# Patient Record
Sex: Female | Born: 1970 | Race: Black or African American | Hispanic: No | Marital: Single | Smoking: Never smoker
Health system: Southern US, Community
[De-identification: ages and names within clinical notes are randomized; demographics above are authoritative.]

## PROBLEM LIST (undated history)

## (undated) DIAGNOSIS — F419 Anxiety disorder, unspecified: Secondary | ICD-10-CM

## (undated) DIAGNOSIS — K219 Gastro-esophageal reflux disease without esophagitis: Secondary | ICD-10-CM

## (undated) DIAGNOSIS — J301 Allergic rhinitis due to pollen: Secondary | ICD-10-CM

## (undated) HISTORY — DX: Anxiety disorder, unspecified: F41.9

## (undated) HISTORY — DX: Allergic rhinitis due to pollen: J30.1

## (undated) HISTORY — DX: Gastro-esophageal reflux disease without esophagitis: K21.9

---

## 2007-08-31 ENCOUNTER — Encounter (INDEPENDENT_AMBULATORY_CARE_PROVIDER_SITE_OTHER): Payer: Self-pay | Admitting: Obstetrics and Gynecology

## 2007-08-31 ENCOUNTER — Ambulatory Visit (HOSPITAL_COMMUNITY): Admission: RE | Admit: 2007-08-31 | Discharge: 2007-08-31 | Payer: Self-pay | Admitting: Obstetrics and Gynecology

## 2008-09-12 ENCOUNTER — Observation Stay (HOSPITAL_COMMUNITY): Admission: AD | Admit: 2008-09-12 | Discharge: 2008-09-12 | Payer: Self-pay | Admitting: Obstetrics and Gynecology

## 2008-09-12 ENCOUNTER — Encounter (INDEPENDENT_AMBULATORY_CARE_PROVIDER_SITE_OTHER): Payer: Self-pay | Admitting: Obstetrics & Gynecology

## 2009-09-08 ENCOUNTER — Ambulatory Visit (HOSPITAL_COMMUNITY): Admission: RE | Admit: 2009-09-08 | Discharge: 2009-09-08 | Payer: Self-pay | Admitting: Obstetrics and Gynecology

## 2010-09-02 LAB — CROSSMATCH: ABO/RH(D): A POS

## 2010-09-02 LAB — CBC
Hemoglobin: 11.9 g/dL — ABNORMAL LOW (ref 12.0–15.0)
RDW: 14.9 % (ref 11.5–15.5)
WBC: 10.1 10*3/uL (ref 4.0–10.5)

## 2010-10-01 ENCOUNTER — Other Ambulatory Visit: Payer: Self-pay | Admitting: Obstetrics and Gynecology

## 2010-10-02 ENCOUNTER — Ambulatory Visit (HOSPITAL_COMMUNITY)
Admission: RE | Admit: 2010-10-02 | Discharge: 2010-10-02 | Disposition: A | Discharge status: Home / Self Care | Payer: BC Managed Care – PPO | Source: Ambulatory Visit | Attending: Obstetrics and Gynecology | Admitting: Obstetrics and Gynecology

## 2010-10-02 ENCOUNTER — Other Ambulatory Visit: Payer: Self-pay | Admitting: Obstetrics and Gynecology

## 2010-10-02 DIAGNOSIS — D259 Leiomyoma of uterus, unspecified: Secondary | ICD-10-CM | POA: Insufficient documentation

## 2010-10-02 DIAGNOSIS — O021 Missed abortion: Secondary | ICD-10-CM | POA: Insufficient documentation

## 2010-10-02 LAB — CBC
Hemoglobin: 13.1 g/dL (ref 12.0–15.0)
MCH: 29.8 pg (ref 26.0–34.0)
MCHC: 33.1 g/dL (ref 30.0–36.0)
MCV: 90.2 fL (ref 78.0–100.0)

## 2010-10-06 NOTE — Op Note (Signed)
NAME:  Summer Robinson, Summer Robinson          ACCOUNT NO.:  [id number]   MEDICAL RECORD NO.:  [id number]          PATIENT TYPE:  INP   LOCATION:  9373                          FACILITY:  [facility]   PHYSICIAN:  Genia Del, M.D.DATE OF BIRTH:  1971/04/09   DATE OF PROCEDURE:  09/12/2008  DATE OF DISCHARGE:                               OPERATIVE REPORT   The patient came to [facility] for surgery on September 12, 2008.   PREOPERATIVE DIAGNOSES:  16+ weeks' gestation with premature rupture of  membranes, cervix dilated to 2 cm, and cord prolapsed.   POSTOPERATIVE DIAGNOSES:  16+ weeks' gestation with premature rupture of  membranes, cervix dilated to 2 cm, and cord prolapsed plus confirmed  empty uterine cavity postprocedure by ultrasound.   PROCEDURE:  Dilatation and extraction under ultrasound guidance.   SURGEON:  Genia Del, MD   ANESTHESIOLOGIST:  Tyrone Apple. Malen Gauze, MD   PROCEDURE:  Under general anesthesia with endotracheal intubation, the  patient was in lithotomy position.  She was prepped with Betadine on the  suprapubic vulvar and vaginal areas and draped as usual.  A vaginal exam  revealed a uterus about 16 cm, mobile, no adnexal mass.  No vaginal  bleeding.  The speculum was introduced in the vagina.  The cervix was  seen.  It was dilated with cord prolapse.  An ultrasound was done at  that point revealing that the fetal heart tones were still present at  1:25.  the fetus was in breech presentation with severe oligo.  We then  grasped the anterior lip of the cervix with a tenaculum.  We proceeded  with a paracervical block with lidocaine 1% a total of 18 mL at 4 and 8  o'clock.  We then dilated the cervix with Hegar dilators.  A #49 passed  easily.  We then completed dilatation up to #55 without difficulty.  We  then used a large fenestrated clamp to extract the fetal parts.  All  fetal parts were removed under ultrasound guidance.  We then used a #16  suction curette  to suction the anterior placenta.  Once this was  achieved, the cavity appeared empty.  We completed the curettage with a  large sharp curette.  We gently curettaged the intrauterine cavity on  all surfaces.  We then go back with the suction curette to assure that  the intrauterine cavity was well emptied.  This was confirmed by  ultrasound.  We saw a thin regular endometrial line and no products of  conception.  We therefore removed the tenaculum.  The uterus was  contracting well.  Hemostasis was adequate.  We therefore removed the  speculum.  We then counted all fetal parts and it was complete.  We sent  a portion of the fetal material for genetic testing.  Full chromosomes  will be done and we sent the rest to Pathology.  The patient had  received cefoxitin 1 g IV prior to induction.  The estimated blood loss  was about 200 mL.  No complication occurred and the patient was brought  to recovery room in good stable status.  Note that  she will be  discharged on Flagyl 500 b.i.d. for 7 days.     Genia Del, M.D.  Electronically Signed    ML/MEDQ  D:  09/12/2008  T:  09/13/2008  Job:  [id number]

## 2010-10-06 NOTE — Op Note (Signed)
NAME:  Summer Robinson, Summer Robinson          ACCOUNT NO.:  [id number]   MEDICAL RECORD NO.:  [id number]          PATIENT TYPE:  AMB   LOCATION:  SDC                           FACILITY:  [facility]   PHYSICIAN:  Lenoard Aden, M.D.DATE OF BIRTH:  02/12/1971   DATE OF PROCEDURE:  08/31/2007  DATE OF DISCHARGE:                               OPERATIVE REPORT   PREOPERATIVE DIAGNOSIS:  Missed abortion.   POSTOPERATIVE DIAGNOSES:  Missed abortion.   PROCEDURE:  Suction dilatation and evacuation.   SURGEON:  Lenoard Aden, M.D.   ANESTHESIA:  MAC, paracervical.   BLOOD LOSS:  Less 50 mL.   COMPLICATIONS:  None.   COUNTS:  Correct.   CONDITION:  Patient brought to recovery room in good condition.   PATHOLOGY:  Specimen sent to pathology.   BRIEF OPERATIVE NOTE:  Being apprised of the risks of anesthesia,  infection, bleeding, injury to abdominal organs, need for repair,  delayed risks of complications to include bowel and bladder injury,  secondary uterine perforation, possible need for repair, the patient was  brought to the operating room where she was administered IV sedation.  After appropriately prepped and draped in sterile fashion, catheterized  until the bladder was emptied.  A loop paracervical block placed using  20 mL of a dilute Xylocaine solution in standard fashion at 4 and 8  o'clock.  Cervix sounds easily to about 12 cm and bimanual exam reveals  known uterine fibroids.  The cervix is easily dilated up to #21 [facility]  dilator, 7 mm suction curette placed.  Suction reveals products of  conception. Repeat suction and curettage in a four quadrant method  reveals cavity to be empty.  Good hemostasis noted.  All incisions  removed from the vagina.  Tissue sent to pathology.  The patient was  sent to recovery in good condition.      Lenoard Aden, M.D.  Electronically Signed     RJT/MEDQ  D:  08/31/2007  T:  08/31/2007  Job:  [id number]

## 2010-10-10 NOTE — Op Note (Signed)
  NAMEMarland Kitchen  Summer Robinson, Summer Robinson           ACCOUNT NO.:  [id number]  MEDICAL RECORD NO.:  [id number]           PATIENT TYPE:  O  LOCATION:  [facility]                          [facility]ACILITY:  [facility]  PHYSICIAN:  Maxie Better, M.D.DATE OF BIRTH:  03/31/1971  DATE OF PROCEDURE:  10/02/2010 DATE OF DISCHARGE:                              OPERATIVE REPORT   PREOPERATIVE DIAGNOSES: 1. Missed abortion. 2. Uterine fibroids. 3. Recurrent pregnancy loss.  PROCEDURE:  Suction, dilation, and evacuation.  POSTOPERATIVE DIAGNOSES: 1. Missed abortion. 2. Uterine fibroids. 3. Recurrent pregnancy loss.  ANESTHESIA:  MAC, paracervical block.  SURGEON:  Maxie Better, MD  ASSISTANT:  None.  PROCEDURE IN DETAIL:  Under adequate monitored anesthesia, the patient was placed in the dorsal lithotomy position.  She was sterilely prepped and draped in usual fashion.  Bladder was catheterized with moderate amount of urine.  Examination under anesthesia revealed an irregular, enlarged uterus.  No adnexal masses could be appreciated.  Bivalve speculum was placed in the vagina.  Single-tooth tenaculum was placed on the anterior lip of the cervix.  20 mL of 1% Nesacaine was injected paracervically at the 3 and 9 o'clock positions.  The cervix was serially dilated up to #33 [facility] dilator.  A #8-mm curved suction cannula was introduced into the uterine cavity.  Large amount of products of conception was obtained.  The cavity was curetted and suctioned until all tissue was felt to be removed, at which time, all instruments were then removed from the vagina.  Specimen labeled products of conception and was sent to pathology.  Estimated blood loss was minimal.  Complications none.  The patient tolerated the procedure well and was transferred to recovery room in stable condition.     Maxie Better, M.D.    [place]/MEDQ  D:  10/02/2010  T:  Oct 17, 2010  Job:  [id number]  Electronically Signed by Nena Jordan  Doaa Kendzierski M.D. on 10/10/2010 06:48:04 PM

## 2010-10-23 DEATH — deceased

## 2011-02-16 LAB — CBC
Hemoglobin: 12.6
MCHC: 33.8
MCV: 91.3

## 2012-08-01 ENCOUNTER — Other Ambulatory Visit: Payer: Self-pay

## 2012-08-01 DIAGNOSIS — Z1231 Encounter for screening mammogram for malignant neoplasm of breast: Secondary | ICD-10-CM

## 2012-08-21 ENCOUNTER — Ambulatory Visit
Admission: RE | Admit: 2012-08-21 | Discharge: 2012-08-21 | Disposition: A | Discharge status: Home / Self Care | Payer: BC Managed Care – PPO | Source: Ambulatory Visit

## 2012-08-21 DIAGNOSIS — Z1231 Encounter for screening mammogram for malignant neoplasm of breast: Secondary | ICD-10-CM

## 2013-07-02 ENCOUNTER — Encounter (HOSPITAL_COMMUNITY): Payer: Self-pay | Admitting: Emergency Medicine

## 2013-07-02 ENCOUNTER — Emergency Department (HOSPITAL_COMMUNITY): Payer: BC Managed Care – PPO

## 2013-07-02 ENCOUNTER — Emergency Department (HOSPITAL_COMMUNITY)
Admission: EM | Admit: 2013-07-02 | Discharge: 2013-07-02 | Disposition: A | Discharge status: Home / Self Care | Payer: BC Managed Care – PPO | Attending: Emergency Medicine | Admitting: Emergency Medicine

## 2013-07-02 DIAGNOSIS — S0993XA Unspecified injury of face, initial encounter: Secondary | ICD-10-CM | POA: Diagnosis present

## 2013-07-02 DIAGNOSIS — Y9389 Activity, other specified: Secondary | ICD-10-CM | POA: Insufficient documentation

## 2013-07-02 DIAGNOSIS — S139XXA Sprain of joints and ligaments of unspecified parts of neck, initial encounter: Secondary | ICD-10-CM | POA: Insufficient documentation

## 2013-07-02 DIAGNOSIS — Y92410 Unspecified street and highway as the place of occurrence of the external cause: Secondary | ICD-10-CM | POA: Insufficient documentation

## 2013-07-02 DIAGNOSIS — IMO0002 Reserved for concepts with insufficient information to code with codable children: Secondary | ICD-10-CM | POA: Insufficient documentation

## 2013-07-02 DIAGNOSIS — Z791 Long term (current) use of non-steroidal anti-inflammatories (NSAID): Secondary | ICD-10-CM | POA: Diagnosis not present

## 2013-07-02 DIAGNOSIS — M545 Low back pain, unspecified: Secondary | ICD-10-CM

## 2013-07-02 DIAGNOSIS — S161XXA Strain of muscle, fascia and tendon at neck level, initial encounter: Secondary | ICD-10-CM

## 2013-07-02 DIAGNOSIS — Z3202 Encounter for pregnancy test, result negative: Secondary | ICD-10-CM | POA: Diagnosis not present

## 2013-07-02 LAB — POCT PREGNANCY, URINE: PREG TEST UR: NEGATIVE

## 2013-07-02 MED ORDER — IBUPROFEN 200 MG PO TABS
400.0000 mg | ORAL_TABLET | Freq: Once | ORAL | Status: AC
  Administered 2013-07-02: 400 mg via ORAL
  Filled 2013-07-02: qty 2

## 2013-07-02 MED ORDER — HYDROCODONE-ACETAMINOPHEN 5-325 MG PO TABS: 1.0000 | ORAL_TABLET | ORAL | Status: DC | PRN

## 2013-07-02 MED ORDER — IBUPROFEN 800 MG PO TABS: 800.0000 mg | ORAL_TABLET | Freq: Three times a day (TID) | ORAL | Status: DC

## 2013-07-02 MED ORDER — CYCLOBENZAPRINE HCL 10 MG PO TABS: 10.0000 mg | ORAL_TABLET | Freq: Every day | ORAL | Status: DC
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
## 2013-07-02 NOTE — ED Notes (Signed)
Pt observed in lobby during registration. Pt alert , appropriate

## 2013-07-02 NOTE — Discharge Instructions (Signed)
Call for a follow up appointment with a Family or Primary Care Provider.  Return if Symptoms worsen.   Take medication as prescribed.    Emergency Department Resource Guide 1) Find a Doctor and Pay Out of Pocket Although you won't have to find out who is covered by your insurance plan, it is a good idea to ask around and get recommendations. You will then need to call the office and see if the doctor you have chosen will accept you as a new patient and what types of options they offer for patients who are self-pay. Some doctors offer discounts or will set up payment plans for their patients who do not have insurance, but you will need to ask so you aren't surprised when you get to your appointment.  2) Contact Your Local Health Department Not all health departments have doctors that can see patients for sick visits, but many do, so it is worth a call to see if yours does. If you don't know where your local health department is, you can check in your phone book. The CDC also has a tool to help you locate your state's health department, and many state websites also have listings of all of their local health departments.  3) Find a [facility] If your illness is not likely to be very severe or complicated, you may want to try a walk in clinic. These are popping up all over the country in pharmacies, drugstores, and shopping centers. They're usually staffed by nurse practitioners or physician assistants that have been trained to treat common illnesses and complaints. They're usually fairly quick and inexpensive. However, if you have serious medical issues or chronic medical problems, these are probably not your best option.  No Primary Care Doctor: - Call Health Connect at  [phone] - they can help you locate a primary care doctor that  accepts your insurance, provides certain services, etc. - Physician Referral Service- [phone]  Chronic Pain Problems: Organization         Address  Phone    Notes  [facility]  [phone] Patients need to be referred by their primary care doctor.   Medication Assistance: Organization         Address  Phone   Notes  [facility] Medication [facility] [address] [place], [place] [address] [phone] --Must be a resident of [facility] -- Must have NO insurance coverage whatsoever (no Medicaid/ Medicare, etc.) -- The pt. MUST have a primary care doctor that directs their care regularly and follows them in the community   [facility]  [phone]   [employer]  [phone]    Agencies that provide inexpensive medical care: Organization         Address  Phone   Notes  [place]  [phone]   [facility]    [phone]   [facility] [place], [place] [address] [phone]   [place] [address] [place] [phone]   Planned Parenthood    [phone]   [facility]    [phone]   [place] and [place] [address], [place] Phone:  [phone], Fax:  [phone] Hours of Operation:  9 am - 6 pm, M-F.  Also accepts Medicaid/Medicare and self-pay.  [facility] [place] [address], [place]  Phone: [phone], Fax: [phone]. Hours of Operation:  8:30 am - 5:30 pm, M-F.  Also accepts Medicaid and self-pay.  °[facility] [place] [address], [place] Phone: ([phone]) [phone]   °[facility] [address], [place], [place] ([phone], Ext. 123 Mondays & Thursdays: 7-9 AM.  First 15 patients are seen on a first come, first serve basis. °  ° °Medicaid-accepting [place] Providers: ° °Organization         Address  Phone   Notes  °[facility] [address], [place] ([phone]) [phone] Also accepts self-pay patients.  °[facility]  [address], [place] ° ([phone]) [phone]   °[facility] [address], [place] ([phone]) [phone]   °[facility] [address] [place] [address], [place] ([phone]) [phone]   °Veita Bland [address], [place]  ° ([phone]) [phone] Only accepts [place] Access Medicaid patients after they have their name applied to their card.  ° °Self-Pay (no insurance) in [place]: ° °Organization         Address  Phone   Notes  °Sickle Cell Patients, [facility] [address], [place] ([phone]) [phone]   °[facility] [address], [place] ([phone]) [phone]   °[facility] ° [address], [place] ([phone]) [phone]   °[facility]/Dr. Osei-Bonsu ° [address] [place] [address], [place] or [address], [place] [phone]) [phone] Phone number for both [place] and [place] locations is the same.  °[facility] [address], [place] ([phone]) [phone]   °[facility] [address] [place] [address], [place] or [address] ([phone]) [phone] °(336) [phone]   °[facility] [address], [place] (336) [phone], phone; ([phone], fax Sees patients 1st and 3rd Saturday of every month.  Must not qualify for public or private insurance (i.e. Medicaid, Medicare, [place] Health Choice, Veterans' Benefits) • Household income should be no more than 200% of the poverty level •The clinic cannot treat you if you are pregnant or think you are pregnant • Sexually transmitted diseases are not treated at the clinic.  ° ° °Dental Care: °Organization         Address  Phone  Notes  °[facility] [facility] [address], [place] ([phone]) [phone] Accepts children up to age 21 who are enrolled in Medicaid or [place] Health Choice; pregnant women with a Medicaid card; and children who have  applied for Medicaid or [place] Health Choice, but were declined, whose parents can pay a reduced fee at time of service.  °[place] [facility] [place]  [address], [place] (336) [phone] Accepts children up to age 21 who are enrolled in Medicaid or  Health Choice; pregnant women with a Medicaid card; and children who have applied for Medicaid or  Health Choice, but were declined, whose parents can pay a reduced fee at time of service.  °[facility] ° [address], [place] ([phone]) [phone] Patients are seen by appointment only. Walk-ins are not accepted. [facility] will see patients 18 years of age and older. °Monday - Tuesday (8am-5pm) °Most Wednesdays (8:30-5pm) °$30 per visit, cash only  °[facility] ° [address], [place] ([phone]) [phone] Patients are seen by appointment only. Walk-ins are not accepted. [facility] will see patients 18 years of age and older. °One Wednesday Evening (Monthly: Volunteer Based).  $30 per visit, cash only  °[facility]  ([phone]) [phone] for adults; Children under age 4, call Graduate Pediatric Dentistry at [phone]. Children aged 4-14, please call [phone] to request a pediatric application. ° Dental services are provided in all areas of dental care including fillings, crowns and bridges, complete and partial dentures, implants, gum treatment, root canals, and extractions. Preventive care is also provided. Treatment is provided to both adults and children. °Patients are selected via a lottery and there is often a waiting list. °  °[facility] [address], °[place] ° (336) 763-8833 [email or web address] °  °[facility] [address], [place], [place] (336)[phone], Ext. 123 Second and Fourth Thursday of each month, opens at 6:30 AM; Clinic ends at 9 AM.  Patients are seen on a first-come first-served basis, and a  limited number are seen during each clinic.  ° °[facility] ° [address], [place], [place] ([phone]) [phone]   Eligibility Requirements °You must have lived in [place], [place], or [place] counties for at least the last three months. °  You cannot be eligible for state or federal sponsored healthcare insurance, including Veterans Administration, Medicaid, or Medicare. °  You generally cannot be eligible for healthcare insurance through your employer.  °  How to apply: °Eligibility screenings are held every Tuesday and Wednesday afternoon from 1:00 pm until 4:00 pm. You do not need an appointment for the interview!  °[facility] [address], [place], [place] [phone]   °[facility]  336-342-8273   °[facility]  336-703-3100   °[facility]  336-570-6415   ° °Behavioral Health Resources in the Community: °Intensive Outpatient Programs °Organization         Address  Phone  Notes  °[facility] [address], [place], [place] 336-878-6098   °[facility] [address], [place], [place] 336-832-9800   °[facility] [address], [place], [place] ° 336-882-2125   °[facility] [address],  °[place], [place] 1-800-853-5163 or 336-641-4981   °Substance Abuse Resources °Organization         Address  Phone  Notes  °[facility]  336-882-2125   °[facility]  336-784-9470   °[facility]  336-285-9073   °[facility]  336-845-3988   °Residential & Outpatient Substance Abuse Program  1-800-659-3381   °Psychological Services °Organization         Address  Phone  Notes  °[facility]  336- [phone]   °[facility]  336- 378-7881   °[facility] [address], [place] 1-800-853-5163 or 336-641-4981   ° °Mobile Crisis Teams °Organization          Address  Phone  Notes  °[facility], Mobile Crisis Care Unit  1-877-626-1772   °Assertive °[facility] ° [address] [place], [place] 336-834-9664   °Sharon DeEsch [address] °[place]  336-554-5454   ° °Self-Help/Support Groups °Organization         Address  Phone             Notes  °[facility]  - variety of   support groups  336- [phone] Call for more information  Narcotics Anonymous (NA), [facility] [address] [employer] [place]  2 meetings at this location

## 2013-07-05 NOTE — ED Provider Notes (Signed)
Medical screening examination/treatment/procedure(s) were performed by non-physician practitioner and as supervising physician I was immediately available for consultation/collaboration.  EKG Interpretation   None         Dion Sibal S Jadine Brumley, MD 07/05/13 0741 

## 2013-08-10 ENCOUNTER — Other Ambulatory Visit: Payer: Self-pay

## 2013-08-10 DIAGNOSIS — Z1231 Encounter for screening mammogram for malignant neoplasm of breast: Secondary | ICD-10-CM

## 2013-08-22 ENCOUNTER — Ambulatory Visit: Payer: BC Managed Care – PPO

## 2015-06-03 DIAGNOSIS — M1990 Unspecified osteoarthritis, unspecified site: Secondary | ICD-10-CM | POA: Insufficient documentation

## 2016-08-24 DIAGNOSIS — R030 Elevated blood-pressure reading, without diagnosis of hypertension: Secondary | ICD-10-CM | POA: Insufficient documentation

## 2016-08-24 DIAGNOSIS — M5442 Lumbago with sciatica, left side: Secondary | ICD-10-CM | POA: Insufficient documentation

## 2018-01-26 ENCOUNTER — Ambulatory Visit: Payer: Self-pay | Admitting: Family Medicine

## 2018-01-26 ENCOUNTER — Encounter: Payer: Self-pay | Admitting: Family Medicine

## 2018-01-26 ENCOUNTER — Ambulatory Visit: Payer: BC Managed Care – PPO | Admitting: Family Medicine

## 2018-01-26 ENCOUNTER — Ambulatory Visit: Payer: Self-pay

## 2018-01-26 VITALS — BP 148/90 | HR 85 | Ht 62.0 in | Wt 156.0 lb

## 2018-01-26 DIAGNOSIS — M25511 Pain in right shoulder: Secondary | ICD-10-CM

## 2018-01-26 DIAGNOSIS — G8929 Other chronic pain: Secondary | ICD-10-CM

## 2018-01-26 DIAGNOSIS — M7551 Bursitis of right shoulder: Secondary | ICD-10-CM

## 2018-01-26 MED ORDER — VITAMIN D (ERGOCALCIFEROL) 1.25 MG (50000 UNIT) PO CAPS: 50000.0000 [IU] | ORAL_CAPSULE | ORAL | 0 refills | Status: DC

## 2018-01-26 NOTE — Patient Instructions (Signed)
Good to see you  A very bad bursitis in the shoulder Ice 20 minutes 2 times daily. Usually after activity and before bed. pennsaid pinkie amount topically 2 times daily as needed.  Exercises 3 times a week.  Keep hands within peripheral vision  Drop weight to 50% of what you are doing and increase 10-15% a week  Once weekly vitamin D for 12 weeks See me again in 4 weeks

## 2018-01-26 NOTE — Progress Notes (Signed)
[facility] [address], [place] [address] Phone: [phone] Subjective:     I Summer Robinson am serving as a Education administrator for Dr. Hulan Robinson.   CC: Right shoulder pain  VZD:GLOVFIEPPI  Summer Robinson is a 47 y.o. female coming in with complaint of right shoulder pain. Numbness and tingling to the finger tips. No neck pain noted.  Onset- Chronic  Location- Anterior Duration- Consistent  Character- Sharp, achy Aggravating factors- movement Therapies tried- Ice, heat, oral meds (Ibpro. 800) Biofreeze,  Severity-6 out of 10     Past Medical History:  Diagnosis Date  . GERD (gastroesophageal reflux disease)   . Hay fever    No past surgical history on file. Social History   Socioeconomic History  . Marital status: Single    Spouse name: Not on file  . Number of children: Not on file  . Years of education: Not on file  . Highest education level: Not on file  Occupational History  . Not on file  Social Needs  . Financial resource strain: Not on file  . Food insecurity:    Worry: Not on file    Inability: Not on file  . Transportation needs:    Medical: Not on file    Non-medical: Not on file  Tobacco Use  . Smoking status: Never Smoker  . Smokeless tobacco: Never Used  Substance and Sexual Activity  . Alcohol use: Yes    Comment: Socially   . Drug use: No  . Sexual activity: Not Currently    Partners: Female  Lifestyle  . Physical activity:    Days per week: Not on file    Minutes per session: Not on file  . Stress: Not on file  Relationships  . Social connections:    Talks on phone: Not on file    Gets together: Not on file    Attends religious service: Not on file    Active member of club or organization: Not on file    Attends meetings of clubs or organizations: Not on file    Relationship status: Not on file  Other Topics Concern  . Not on file  Social History Narrative  . Not on file   No Known  Allergies Family History  Problem Relation Age of Onset  . Diabetes Mother   . High blood pressure Mother   . High Cholesterol Mother   . Miscarriages / [place] Mother   . Cancer Father     No family history of autoimmune     Current Outpatient Medications (Analgesics):  .  HYDROcodone-acetaminophen (NORCO/VICODIN) 5-325 MG per tablet, Take 1 tablet by mouth every 4 (four) hours as needed. Take with meals .  ibuprofen (ADVIL,MOTRIN) 800 MG tablet, Take 1 tablet (800 mg total) by mouth 3 (three) times daily. Take with meals   Current Outpatient Medications (Other):  .  cyclobenzaprine (FLEXERIL) 10 MG tablet, Take 1 tablet (10 mg total) by mouth at bedtime. .  Multiple Vitamin (MULTIVITAMIN WITH MINERALS) TABS tablet, Take 1 tablet by mouth daily. .  Vitamin D, Ergocalciferol, (DRISDOL) 50000 units CAPS capsule, Take 1 capsule (50,000 Units total) by mouth every 7 (seven) days.    Past medical history, social, surgical and family history all reviewed in electronic medical record.  No pertanent information unless stated regarding to the chief complaint.   Review of Systems:  No headache, visual changes, nausea, vomiting, diarrhea, constipation, dizziness, abdominal pain, skin rash, fevers, chills, night  sweats, weight loss, swollen lymph nodes, body aches, joint swelling, muscle aches, chest pain, shortness of breath, mood changes.   Objective  Blood pressure (!) 148/90, pulse 85, height 5\' 2"  (1.575 m), weight 156 lb (70.8 kg), SpO2 98 %.    General: No apparent distress alert and oriented x3 mood and affect normal, dressed appropriately.  HEENT: Pupils equal, extraocular movements intact  Respiratory: Patient's speak in full sentences and does not appear short of breath  Cardiovascular: No lower extremity edema, non tender, no erythema  Skin: Warm dry intact with no signs of infection or rash on extremities or on axial skeleton.  Abdomen: Soft nontender  Neuro: Cranial  nerves II through XII are intact, neurovascularly intact in all extremities with 2+ DTRs and 2+ pulses.  Lymph: No lymphadenopathy of posterior or anterior cervical chain or axillae bilaterally.  Gait normal with good balance and coordination.  MSK:  Non tender with full range of motion and good stability and symmetric strength and tone of elbows, wrist, hip, knee and ankles bilaterally.  Shoulder: Right Inspection reveals no abnormalities, atrophy or asymmetry. Palpation is normal with no tenderness over AC joint or bicipital groove. ROM is full in all planes passively. Rotator cuff strength normal throughout. signs of impingement with positive Neer and Hawkin's tests, but negative empty can sign. Speeds and Yergason's tests normal. No labral pathology noted with negative Obrien's, negative clunk and good stability. Normal scapular function observed. No painful arc and no drop arm sign. No apprehension sign Contralateral shoulder unremarkable  MSK US performed of: Right This study was ordered, performed, and interpreted by Charlann Boxer D.O.  Shoulder:   Supraspinatus:  Appears normal on long and transverse views, Bursal bulge seen with shoulder abduction on impingement view. Infraspinatus:  Appears normal on long and transverse views. Significant increase in Doppler flow Subscapularis:  Appears normal on long and transverse views. Positive bursa Teres Minor:  Appears normal on long and transverse views. AC joint:  Capsule undistended, no geyser sign. Glenohumeral Joint:  Appears normal without effusion. Glenoid Labrum:  Intact without visualized tears. Biceps Tendon:  Appears normal on long and transverse views, no fraying of tendon, tendon located in intertubercular groove, no subluxation with shoulder internal or external rotation.  Impression: Subacromial bursitis  97110; 15 additional minutes spent for Therapeutic exercises as stated in above notes.  This included exercises  focusing on stretching, strengthening, with significant focus on eccentric aspects.   Long term goals include an improvement in range of motion, strength, endurance as well as avoiding reinjury. Patient's frequency would include in 1-2 times a day, 3-5 times a week for a duration of 6-12 weeks. Shoulder Exercises that included:  Basic scapular stabilization to include adduction and depression of scapula Scaption, focusing on proper movement and good control Internal and External rotation utilizing a theraband, with elbow tucked at side entire time Rows with theraband which was given   Proper technique shown and discussed handout in great detail with ATC.  All questions were discussed and answered.      Impression and Recommendations:     This case required medical decision making of moderate complexity. The above documentation has been reviewed and is accurate and complete Lyndal Pulley, DO       Note: This dictation was prepared with Dragon dictation along with smaller phrase technology. Any transcriptional errors that result from this process are unintentional.

## 2018-01-26 NOTE — Assessment & Plan Note (Signed)
Moderate.  Discussed over-the-counter medications, once weekly vitamin D, topical anti-inflammatories, patient work with Product/process development scientist to learn home exercises in greater detail.  Discussed ergonomics and proper lifting mechanics.  Follow-up with me again in 4 to 6 weeks.

## 2018-03-01 ENCOUNTER — Other Ambulatory Visit (INDEPENDENT_AMBULATORY_CARE_PROVIDER_SITE_OTHER): Payer: BC Managed Care – PPO

## 2018-03-01 ENCOUNTER — Ambulatory Visit: Payer: BC Managed Care – PPO | Admitting: Nurse Practitioner

## 2018-03-01 ENCOUNTER — Encounter: Payer: Self-pay | Admitting: Nurse Practitioner

## 2018-03-01 VITALS — BP 160/100 | HR 69 | Ht 62.0 in | Wt 164.0 lb

## 2018-03-01 DIAGNOSIS — R030 Elevated blood-pressure reading, without diagnosis of hypertension: Secondary | ICD-10-CM | POA: Diagnosis not present

## 2018-03-01 DIAGNOSIS — Z114 Encounter for screening for human immunodeficiency virus [HIV]: Secondary | ICD-10-CM | POA: Diagnosis not present

## 2018-03-01 DIAGNOSIS — Z6830 Body mass index (BMI) 30.0-30.9, adult: Secondary | ICD-10-CM

## 2018-03-01 DIAGNOSIS — Z13220 Encounter for screening for lipoid disorders: Secondary | ICD-10-CM

## 2018-03-01 DIAGNOSIS — Z23 Encounter for immunization: Secondary | ICD-10-CM

## 2018-03-01 DIAGNOSIS — M7551 Bursitis of right shoulder: Secondary | ICD-10-CM

## 2018-03-01 DIAGNOSIS — M6530 Trigger finger, unspecified finger: Secondary | ICD-10-CM | POA: Insufficient documentation

## 2018-03-01 LAB — COMPREHENSIVE METABOLIC PANEL
ALBUMIN: 4.1 g/dL (ref 3.5–5.2)
ALK PHOS: 71 U/L (ref 39–117)
ALT: 13 U/L (ref 0–35)
AST: 20 U/L (ref 0–37)
BILIRUBIN TOTAL: 0.3 mg/dL (ref 0.2–1.2)
BUN: 15 mg/dL (ref 6–23)
CALCIUM: 9.5 mg/dL (ref 8.4–10.5)
CHLORIDE: 103 meq/L (ref 96–112)
CO2: 27 mEq/L (ref 19–32)
CREATININE: 0.79 mg/dL (ref 0.40–1.20)
GFR: 100.22 mL/min (ref >60.00)
Glucose, Bld: 96 mg/dL (ref 70–99)
Potassium: 3.7 mEq/L (ref 3.5–5.1)
Sodium: 139 mEq/L (ref 135–145)
TOTAL PROTEIN: 8.1 g/dL (ref 6.0–8.3)

## 2018-03-01 LAB — CBC
HEMATOCRIT: 38.9 % (ref 36.0–46.0)
Hemoglobin: 12.6 g/dL (ref 12.0–15.0)
MCHC: 32.3 g/dL (ref 30.0–36.0)
MCV: 88.4 fl (ref 78.0–100.0)
Platelets: 187 10*3/uL (ref 150.0–400.0)
RBC: 4.4 Mil/uL (ref 3.87–5.11)
RDW: 16.5 % — ABNORMAL HIGH (ref 11.5–15.5)
WBC: 4.3 10*3/uL (ref 4.0–10.5)

## 2018-03-01 LAB — LIPID PANEL
CHOLESTEROL: 176 mg/dL (ref 0–200)
HDL: 75.5 mg/dL (ref >39.00)
LDL Cholesterol: 81 mg/dL (ref 0–99)
NonHDL: 100.88
TRIGLYCERIDES: 99 mg/dL (ref 0.0–149.0)
Total CHOL/HDL Ratio: 2
VLDL: 19.8 mg/dL (ref 0.0–40.0)

## 2018-03-01 LAB — HEMOGLOBIN A1C: Hgb A1c MFr Bld: 5.9 % (ref 4.6–6.5)

## 2018-03-01 LAB — TSH: TSH: 1.65 u[IU]/mL (ref 0.35–4.50)

## 2018-03-01 NOTE — Assessment & Plan Note (Signed)
Continue SM follow up as planned

## 2018-03-01 NOTE — Assessment & Plan Note (Signed)
Discussed the role of healthy diet and exercise in the management of weight, additional education provided on AVS Update labs today - CBC; Future - Comprehensive metabolic panel; Future - TSH; Future - Lipid panel; Future - Hemoglobin A1c; Future

## 2018-03-01 NOTE — Progress Notes (Signed)
Summer Robinson is a 47 y.o. female with the following history as recorded in EpicCare:  Patient Active Problem List   Diagnosis Date Noted  . Acute bursitis of right shoulder 01/26/2018    Current Outpatient Medications  Medication Sig Dispense Refill  . ibuprofen (ADVIL,MOTRIN) 800 MG tablet Take 1 tablet (800 mg total) by mouth 3 (three) times daily. Take with meals 30 tablet 0  . Multiple Vitamin (MULTIVITAMIN WITH MINERALS) TABS tablet Take 1 tablet by mouth daily.    . valACYclovir (VALTREX) 500 MG tablet Take 500 mg by mouth 2 (two) times daily as needed.  2  . Vitamin D, Ergocalciferol, (DRISDOL) 50000 units CAPS capsule Take 1 capsule (50,000 Units total) by mouth every 7 (seven) days. 12 capsule 0  . cyclobenzaprine (FLEXERIL) 10 MG tablet Take 1 tablet (10 mg total) by mouth at bedtime. (Patient not taking: Reported on 03/01/2018) 20 tablet 0  . HYDROcodone-acetaminophen (NORCO/VICODIN) 5-325 MG per tablet Take 1 tablet by mouth every 4 (four) hours as needed. Take with meals (Patient not taking: Reported on 03/01/2018) 10 tablet 0  . meloxicam (MOBIC) 15 MG tablet Take 15 mg by mouth daily.  2   No current facility-administered medications for this visit.     Allergies: Patient has no known allergies.  Past Medical History:  Diagnosis Date  . GERD (gastroesophageal reflux disease)   . Hay fever     History reviewed. No pertinent surgical history.  Family History  Problem Relation Age of Onset  . Diabetes Mother   . High blood pressure Mother   . High Cholesterol Mother   . Miscarriages / [place] Mother   . Cancer Father     Social History   Tobacco Use  . Smoking status: Never Smoker  . Smokeless tobacco: Never Used  Substance Use Topics  . Alcohol use: Yes    Comment: Socially      Subjective:   [name] is here today to establish care as a new patient to our practice, transferring from another PCP due to her prior PCP leaving office. Aside from  primary care, she is routinely followed by [facility] for chronic shoulder pain and trigger finger, she does not take any daily medications but does take meloxicam about every other day for her orthopedic complaints, also taking ibuprofen 800 occassionally. She is asking for annual labs today, she is fasting. We will also discuss her elevated blood pressure, she has not been maintained on BP medications in the past, states she knows she should work on diet and exercise and absolutely is not willing to start a daily antihypertensive, says she wont take it even if Rx is sent. She overall feels well today, no complaints Denies LOC, headaches, vision changes, chest pain, shortness of breath, edema.  BP Readings from Last 3 Encounters:  03/01/18 (!) 160/100  01/26/18 (!) 148/90  07/02/13 138/86    ROS - See HPI  Objective:  Vitals:   03/01/18 0850 03/01/18 0930  BP: (!) 158/96 (!) 160/100  Pulse: 69   SpO2: 98%   Weight: 164 lb (74.4 kg)   Height: 5\' 2"  (1.575 m)     Body mass index is 30 kg/m.  General: Well developed, well nourished, in no acute distress  Skin : Warm and dry.  Head: Normocephalic and atraumatic  Eyes: Sclera and conjunctiva clear; pupils round and reactive to light; extraocular movements intact  Oropharynx: Pink, supple. No suspicious lesions  Neck: Supple without thyromegaly Lungs: Respirations unlabored;  clear to auscultation bilaterally without wheeze, rales, rhonchi  CVS exam: normal rate, regular rhythm, normal S1, S2, no murmurs, rubs, clicks or gallops, distal pulses intact Musculoskeletal: No deformities Extremities: No edema Neurologic: Alert and oriented; speech intact; face symmetrical; moves all extremities well; CNII-XII intact without focal deficit   Physical Exam   Assessment:  1. Elevated blood pressure reading   2. Need for Tdap vaccination   3. BMI 30.0-30.9,adult   4. Screening for HIV (human immunodeficiency virus)   5. Screening for cholesterol  level   6. Acute bursitis of right shoulder   7. Trigger finger, unspecified finger, unspecified laterality     Plan:   Return in about 3 weeks (around 03/22/2018) for CPE, pap.  Orders Placed This Encounter  Procedures  . Tdap vaccine greater than or equal to 47yo IM  . CBC    Standing Status:   Future    Standing Expiration Date:   03/02/2019  . Comprehensive metabolic panel    Standing Status:   Future    Standing Expiration Date:   03/02/2019  . TSH    Standing Status:   Future    Standing Expiration Date:   03/02/2019  . Lipid panel    Standing Status:   Future    Standing Expiration Date:   03/02/2019  . Hemoglobin A1c    Standing Status:   Future    Standing Expiration Date:   03/02/2019  . HIV Antibody (routine testing w rflx)    Standing Status:   Future    Standing Expiration Date:   04/01/2018    Requested Prescriptions    No prescriptions requested or ordered in this encounter     Reviewed HM: Need for Tdap vaccination- Tdap vaccine greater than or equal to 47yo IM Overdue for PAP, she will return in about 3 weeks for CPE with pap Screening for HIV (human immunodeficiency virus)- HIV Antibody (routine testing w rflx); Future Screening for cholesterol level-She is fasting- Lipid panel; Future  Elevated blood pressure reading Noted on 2 readings today and on past recorded readings Discussed the long term risks of uncontrolled HTN including MI, stroke, renal failure, she remains firm that she does not want to start a daily BP med We also discussed that NSAIDS, mobic could contribute, she will try to reduce these medications and continue regular F/U with SM for orthopedic complaints Update labs today Dash diet and regular physical activity discussed and additional information printed on AVS RTC in 3 weeks for CPE, recheck BP - CBC; Future - Comprehensive metabolic panel; Future - TSH; Future - Lipid panel; Future

## 2018-03-01 NOTE — Patient Instructions (Addendum)
Please return in about 3-4 weeks for annual check up with PAP   DASH Eating Plan DASH stands for "Dietary Approaches to Stop Hypertension." The DASH eating plan is a healthy eating plan that has been shown to reduce high blood pressure (hypertension). It may also reduce your risk for type 2 diabetes, heart disease, and stroke. The DASH eating plan may also help with weight loss. What are tips for following this plan? General guidelines  Avoid eating more than 2,300 mg (milligrams) of salt (sodium) a day. If you have hypertension, you may need to reduce your sodium intake to 1,500 mg a day.  Limit alcohol intake to no more than 1 drink a day for nonpregnant women and 2 drinks a day for men. One drink equals 12 oz of beer, 5 oz of wine, or 1 oz of hard liquor.  Work with your health care provider to maintain a healthy body weight or to lose weight. Ask what an ideal weight is for you.  Get at least 30 minutes of exercise that causes your heart to beat faster (aerobic exercise) most days of the week. Activities may include walking, swimming, or biking.  Work with your health care provider or diet and nutrition specialist (dietitian) to adjust your eating plan to your individual calorie needs. Reading food labels  Check food labels for the amount of sodium per serving. Choose foods with less than 5 percent of the Daily Value of sodium. Generally, foods with less than 300 mg of sodium per serving fit into this eating plan.  To find whole grains, look for the word "whole" as the first word in the ingredient list. Shopping  Buy products labeled as "low-sodium" or "no salt added."  Buy fresh foods. Avoid canned foods and premade or frozen meals. Cooking  Avoid adding salt when cooking. Use salt-free seasonings or herbs instead of table salt or sea salt. Check with your health care provider or pharmacist before using salt substitutes.  Do not fry foods. Cook foods using healthy methods such as  baking, boiling, grilling, and broiling instead.  Cook with heart-healthy oils, such as olive, canola, soybean, or sunflower oil. Meal planning   Eat a balanced diet that includes: ? 5 or more servings of fruits and vegetables each day. At each meal, try to fill half of your plate with fruits and vegetables. ? Up to 6-8 servings of whole grains each day. ? Less than 6 oz of lean meat, poultry, or fish each day. A 3-oz serving of meat is about the same size as a deck of cards. One egg equals 1 oz. ? 2 servings of low-fat dairy each day. ? A serving of nuts, seeds, or beans 5 times each week. ? Heart-healthy fats. Healthy fats called Omega-3 fatty acids are found in foods such as flaxseeds and coldwater fish, like sardines, salmon, and mackerel.  Limit how much you eat of the following: ? Canned or prepackaged foods. ? Food that is high in trans fat, such as fried foods. ? Food that is high in saturated fat, such as fatty meat. ? Sweets, desserts, sugary drinks, and other foods with added sugar. ? Full-fat dairy products.  Do not salt foods before eating.  Try to eat at least 2 vegetarian meals each week.  Eat more home-cooked food and less restaurant, buffet, and fast food.  When eating at a restaurant, ask that your food be prepared with less salt or no salt, if possible. What foods are recommended? The  items listed may not be a complete list. Talk with your dietitian about what dietary choices are best for you. Grains Whole-grain or whole-wheat bread. Whole-grain or whole-wheat pasta. Brown rice. [place]. Bulgur. Whole-grain and low-sodium cereals. Pita bread. Low-fat, low-sodium crackers. Whole-wheat flour tortillas. Vegetables Fresh or frozen vegetables (raw, steamed, roasted, or grilled). Low-sodium or reduced-sodium tomato and vegetable juice. Low-sodium or reduced-sodium tomato sauce and tomato paste. Low-sodium or reduced-sodium canned vegetables. Fruits All fresh,  dried, or frozen fruit. Canned fruit in natural juice (without added sugar). Meat and other protein foods Skinless chicken or [place]. Ground chicken or [place]. Pork with fat trimmed off. Fish and seafood. Egg whites. Dried beans, peas, or lentils. Unsalted nuts, nut butters, and seeds. Unsalted canned beans. Lean cuts of beef with fat trimmed off. Low-sodium, lean deli meat. Dairy Low-fat (1%) or fat-free (skim) milk. Fat-free, low-fat, or reduced-fat cheeses. Nonfat, low-sodium ricotta or cottage cheese. Low-fat or nonfat yogurt. Low-fat, low-sodium cheese. Fats and oils Soft margarine without trans fats. Vegetable oil. Low-fat, reduced-fat, or light mayonnaise and salad dressings (reduced-sodium). Canola, safflower, olive, soybean, and sunflower oils. Avocado. Seasoning and other foods Herbs. Spices. Seasoning mixes without salt. Unsalted popcorn and pretzels. Fat-free sweets. What foods are not recommended? The items listed may not be a complete list. Talk with your dietitian about what dietary choices are best for you. Grains Baked goods made with fat, such as croissants, muffins, or some breads. Dry pasta or rice meal packs. Vegetables Creamed or fried vegetables. Vegetables in a cheese sauce. Regular canned vegetables (not low-sodium or reduced-sodium). Regular canned tomato sauce and paste (not low-sodium or reduced-sodium). Regular tomato and vegetable juice (not low-sodium or reduced-sodium). Angie Fava. Olives. Fruits Canned fruit in a light or heavy syrup. Fried fruit. Fruit in cream or butter sauce. Meat and other protein foods Fatty cuts of meat. Ribs. Fried meat. Berniece Salines. Sausage. Bologna and other processed lunch meats. Salami. Fatback. Hotdogs. Bratwurst. Salted nuts and seeds. Canned beans with added salt. Canned or smoked fish. Whole eggs or egg yolks. Chicken or [place] with skin. Dairy Whole or 2% milk, cream, and half-and-half. Whole or full-fat cream cheese. Whole-fat or sweetened  yogurt. Full-fat cheese. Nondairy creamers. Whipped toppings. Processed cheese and cheese spreads. Fats and oils Butter. Stick margarine. Lard. Shortening. Ghee. Bacon fat. Tropical oils, such as coconut, palm kernel, or palm oil. Seasoning and other foods Salted popcorn and pretzels. Onion salt, garlic salt, seasoned salt, table salt, and sea salt. Worcestershire sauce. Tartar sauce. Barbecue sauce. Teriyaki sauce. Soy sauce, including reduced-sodium. Steak sauce. Canned and packaged gravies. Fish sauce. Oyster sauce. Cocktail sauce. Horseradish that you find on the shelf. Ketchup. Mustard. Meat flavorings and tenderizers. Bouillon cubes. Hot sauce and Tabasco sauce. Premade or packaged marinades. Premade or packaged taco seasonings. Relishes. Regular salad dressings. Where to find more information:  National Heart, Lung, and [place]: https://wilson-eaton.com/  American Heart Association: www.heart.org Summary  The DASH eating plan is a healthy eating plan that has been shown to reduce high blood pressure (hypertension). It may also reduce your risk for type 2 diabetes, heart disease, and stroke.  With the DASH eating plan, you should limit salt (sodium) intake to 2,300 mg a day. If you have hypertension, you may need to reduce your sodium intake to 1,500 mg a day.  When on the DASH eating plan, aim to eat more fresh fruits and vegetables, whole grains, lean proteins, low-fat dairy, and heart-healthy fats.  Work with your health care provider  or diet and nutrition specialist (dietitian) to adjust your eating plan to your individual calorie needs. This information is not intended to replace advice given to you by your health care provider. Make sure you discuss any questions you have with your health care provider. Document Released: 04/29/2011 Document Revised: 05/03/2016 Document Reviewed: 05/03/2016 Elsevier Interactive Patient Education  [employer].

## 2018-03-02 LAB — HIV ANTIBODY (ROUTINE TESTING W REFLEX): HIV 1&2 Ab, 4th Generation: NONREACTIVE

## 2018-03-03 ENCOUNTER — Other Ambulatory Visit: Payer: Self-pay | Admitting: Nurse Practitioner

## 2018-03-03 ENCOUNTER — Ambulatory Visit: Payer: BC Managed Care – PPO | Admitting: Family Medicine

## 2018-03-03 DIAGNOSIS — R7303 Prediabetes: Secondary | ICD-10-CM

## 2018-03-03 NOTE — Progress Notes (Signed)
refe

## 2018-03-28 ENCOUNTER — Ambulatory Visit: Payer: BC Managed Care – PPO | Admitting: Family Medicine

## 2018-03-28 NOTE — Progress Notes (Deleted)
[name] [place], [place] [address] Phone: [phone] Subjective:    I'm seeing this patient by the request  of:    CC:   [id number]  Summer Robinson is a 47 y.o. female coming in with complaint of ***  Onset-  Location Duration-  Character- Aggravating factors- Reliving factors-  Therapies tried-  Severity-     Past Medical History:  Diagnosis Date  . GERD (gastroesophageal reflux disease)   . Hay fever    No past surgical history on file. Social History   Socioeconomic History  . Marital status: Single    Spouse name: Not on file  . Number of children: Not on file  . Years of education: Not on file  . Highest education level: Not on file  Occupational History  . Not on file  Social Needs  . Financial resource strain: Not on file  . Food insecurity:    Worry: Not on file    Inability: Not on file  . Transportation needs:    Medical: Not on file    Non-medical: Not on file  Tobacco Use  . Smoking status: Never Smoker  . Smokeless tobacco: Never Used  Substance and Sexual Activity  . Alcohol use: Yes    Comment: Socially   . Drug use: No  . Sexual activity: Not Currently    Partners: Female  Lifestyle  . Physical activity:    Days per week: Not on file    Minutes per session: Not on file  . Stress: Not on file  Relationships  . Social connections:    Talks on phone: Not on file    Gets together: Not on file    Attends religious service: Not on file    Active member of club or organization: Not on file    Attends meetings of clubs or organizations: Not on file    Relationship status: Not on file  Other Topics Concern  . Not on file  Social History Narrative  . Not on file   No Known Allergies Family History  Problem Relation Age of Onset  . Diabetes Mother   . High blood pressure Mother   . High Cholesterol Mother   . Miscarriages / [place] Mother   . Cancer Father         Current Outpatient Medications (Analgesics):  .  HYDROcodone-acetaminophen (NORCO/VICODIN) 5-325 MG per tablet, Take 1 tablet by mouth every 4 (four) hours as needed. Take with meals (Patient not taking: Reported on 03/01/2018) .  ibuprofen (ADVIL,MOTRIN) 800 MG tablet, Take 1 tablet (800 mg total) by mouth 3 (three) times daily. Take with meals .  meloxicam (MOBIC) 15 MG tablet, Take 15 mg by mouth daily.   Current Outpatient Medications (Other):  .  cyclobenzaprine (FLEXERIL) 10 MG tablet, Take 1 tablet (10 mg total) by mouth at bedtime. (Patient not taking: Reported on 03/01/2018) .  Multiple Vitamin (MULTIVITAMIN WITH MINERALS) TABS tablet, Take 1 tablet by mouth daily. .  valACYclovir (VALTREX) 500 MG tablet, Take 500 mg by mouth 2 (two) times daily as needed. .  Vitamin D, Ergocalciferol, (DRISDOL) 50000 units CAPS capsule, Take 1 capsule (50,000 Units total) by mouth every 7 (seven) days.    Past medical history, social, surgical and family history all reviewed in electronic medical record.  No pertanent information unless stated regarding to the chief complaint.   Review of Systems:  No headache, visual changes, nausea, vomiting, diarrhea, constipation, dizziness, abdominal  pain, skin rash, fevers, chills, night sweats, weight loss, swollen lymph nodes, body aches, joint swelling, muscle aches, chest pain, shortness of breath, mood changes.   Objective  There were no vitals taken for this visit. Systems examined below as of    General: No apparent distress alert and oriented x3 mood and affect normal, dressed appropriately.  HEENT: Pupils equal, extraocular movements intact  Respiratory: Patient's speak in full sentences and does not appear short of breath  Cardiovascular: No lower extremity edema, non tender, no erythema  Skin: Warm dry intact with no signs of infection or rash on extremities or on axial skeleton.  Abdomen: Soft nontender  Neuro: Cranial nerves II  through XII are intact, neurovascularly intact in all extremities with 2+ DTRs and 2+ pulses.  Lymph: No lymphadenopathy of posterior or anterior cervical chain or axillae bilaterally.  Gait normal with good balance and coordination.  MSK:  Non tender with full range of motion and good stability and symmetric strength and tone of shoulders, elbows, wrist, hip, knee and ankles bilaterally.     Impression and Recommendations:     This case required medical decision making of moderate complexity. The above documentation has been reviewed and is accurate and complete Lyndal Pulley, DO       Note: This dictation was prepared with Dragon dictation along with smaller phrase technology. Any transcriptional errors that result from this process are unintentional.

## 2018-03-29 ENCOUNTER — Ambulatory Visit: Payer: BC Managed Care – PPO | Admitting: Nurse Practitioner

## 2018-03-29 ENCOUNTER — Ambulatory Visit: Payer: BC Managed Care – PPO | Admitting: Family Medicine

## 2018-03-29 DIAGNOSIS — Z0289 Encounter for other administrative examinations: Secondary | ICD-10-CM

## 2018-03-31 ENCOUNTER — Ambulatory Visit: Payer: BC Managed Care – PPO | Admitting: Dietician

## 2018-04-07 ENCOUNTER — Encounter: Payer: Self-pay | Admitting: Nurse Practitioner

## 2018-04-13 ENCOUNTER — Other Ambulatory Visit: Payer: Self-pay | Admitting: Family Medicine

## 2018-04-28 ENCOUNTER — Ambulatory Visit: Payer: BC Managed Care – PPO | Admitting: Nurse Practitioner

## 2018-04-28 ENCOUNTER — Encounter: Payer: Self-pay | Admitting: Nurse Practitioner

## 2018-04-28 ENCOUNTER — Other Ambulatory Visit: Payer: BC Managed Care – PPO

## 2018-04-28 VITALS — BP 138/80 | HR 86 | Ht 62.0 in | Wt 170.0 lb

## 2018-04-28 DIAGNOSIS — N898 Other specified noninflammatory disorders of vagina: Secondary | ICD-10-CM

## 2018-04-28 LAB — WET PREP, GENITAL
TRICH WET PREP: NONE SEEN — AB
Yeast Wet Prep HPF POC: NONE SEEN — AB

## 2018-04-28 NOTE — Patient Instructions (Signed)
I will let you know as soon as labs are back.

## 2018-04-28 NOTE — Progress Notes (Signed)
Summer Robinson is a 47 y.o. female with the following history as recorded in EpicCare:  Patient Active Problem List   Diagnosis Date Noted  . BMI 30.0-30.9,adult 03/01/2018  . Trigger finger 03/01/2018  . Acute bursitis of right shoulder 01/26/2018    Current Outpatient Medications  Medication Sig Dispense Refill  . cyclobenzaprine (FLEXERIL) 10 MG tablet Take 1 tablet (10 mg total) by mouth at bedtime. 20 tablet 0  . HYDROcodone-acetaminophen (NORCO/VICODIN) 5-325 MG per tablet Take 1 tablet by mouth every 4 (four) hours as needed. Take with meals 10 tablet 0  . ibuprofen (ADVIL,MOTRIN) 800 MG tablet Take 1 tablet (800 mg total) by mouth 3 (three) times daily. Take with meals 30 tablet 0  . meloxicam (MOBIC) 15 MG tablet Take 15 mg by mouth daily.  2  . Multiple Vitamin (MULTIVITAMIN WITH MINERALS) TABS tablet Take 1 tablet by mouth daily.    . valACYclovir (VALTREX) 500 MG tablet Take 500 mg by mouth 2 (two) times daily as needed.  2   No current facility-administered medications for this visit.     Allergies: Patient has no known allergies.  Past Medical History:  Diagnosis Date  . GERD (gastroesophageal reflux disease)   . Hay fever     History reviewed. No pertinent surgical history.  Family History  Problem Relation Age of Onset  . Diabetes Mother   . High blood pressure Mother   . High Cholesterol Mother   . Miscarriages / [place] Mother   . Cancer Father     Social History   Tobacco Use  . Smoking status: Never Smoker  . Smokeless tobacco: Never Used  Substance Use Topics  . Alcohol use: Yes    Comment: Socially      Subjective:  Ms Wessells is here today requesting evaluation of vaginal discharge, first began about a week ago, the discharge is not heavy or foul smelling, but thicker and whiter than her normal discharge. She denies fevers, chills, abdominal pain, flank pain, nausea, vomiting, urinary frequency, dysuria, vaginal bleeding, vaginal  itching. She tried monistat OTC and a fluconazole pill at home with no improvement. She says she is not concerned for STDS Monitstat, 1 fluconazole at home- did not help-  No LMP recorded.  ROS- See HPI  Objective:  Vitals:   04/28/18 1556  BP: 138/80  Pulse: 86  SpO2: 97%  Weight: 170 lb (77.1 kg)  Height: 5\' 2"  (1.575 m)    General: Well developed, well nourished, in no acute distress  Skin : Warm and dry.  Head: Normocephalic and atraumatic  Eyes: Sclera and conjunctiva clear; pupils round and reactive to light; extraocular movements intact  Ears: External normal; canals clear; tympanic membranes normal  Oropharynx: Pink, supple. No suspicious lesions  Neck: Supple without thyromegaly, adenopathy  Lungs: Respirations unlabored; clear to auscultation bilaterally without wheeze, rales, rhonchi  CVS exam: normal rate and regular rhythm, S1 and S2 normal.  Abdomen: Soft; nontender; nondistended; normoactive bowel sounds; no masses or hepatosplenomegaly  Genitourinary: Vulva normal. Vulva exhibits no erythema, no exudate, no lesion, no rash and no tenderness. Thin  odorless and vaginal discharge found.  Extremities: No edema, cyanosis, clubbing  Vessels: Symmetric bilaterally  Neurologic: Alert and oriented; speech intact; face symmetrical; moves all extremities well; CNII-XII intact without focal deficit  Psychiatric: Normal mood and affect.  Assessment:  1. Vaginal discharge     Plan:   Aside from discharge she is not having any pain or discomfort, will send  wet prep and follow up with further recommendations pending results  No follow-ups on file.  Orders Placed This Encounter  Procedures  . Wet prep, genital    Standing Status:   Future    Standing Expiration Date:   05/29/2018    Requested Prescriptions    No prescriptions requested or ordered in this encounter

## 2018-05-01 ENCOUNTER — Ambulatory Visit: Payer: Self-pay | Admitting: *Deleted

## 2018-05-01 ENCOUNTER — Other Ambulatory Visit: Payer: Self-pay | Admitting: Nurse Practitioner

## 2018-05-01 MED ORDER — METRONIDAZOLE 500 MG PO TABS: 500.0000 mg | ORAL_TABLET | Freq: Two times a day (BID) | ORAL | 0 refills | Status: DC

## 2018-05-01 NOTE — Telephone Encounter (Signed)
Summary: med question   Pt states she was prescribed metroNIDAZOLE (FLAGYL) 500 MG tablet to take 2 times a day for 7 days. Pt is wanting to know if this the number of days for her to take the medicine can be reduced? She states because she knows after about 3 days she will not take it because she will not remember to take the medicine. Please advise.

## 2018-05-01 NOTE — Telephone Encounter (Signed)
Returned call to patient to discuss her reasoning for taking Flagyl in 3 days as oppose to 7 days. She does not think she will remember to take it. advised her to set her phone or watch to remind her to take her medication. She needs to take it the way it is prescribed in order for it to work. Pt voiced understanding.

## 2018-05-11 ENCOUNTER — Ambulatory Visit: Payer: BC Managed Care – PPO | Admitting: Family Medicine

## 2018-05-11 ENCOUNTER — Ambulatory Visit: Payer: BC Managed Care – PPO | Admitting: Nurse Practitioner

## 2018-07-20 ENCOUNTER — Ambulatory Visit: Payer: BC Managed Care – PPO | Admitting: Nurse Practitioner

## 2018-07-20 ENCOUNTER — Encounter: Payer: Self-pay | Admitting: Nurse Practitioner

## 2018-07-20 VITALS — BP 132/90 | HR 76 | Temp 98.8°F | Wt 177.1 lb

## 2018-07-20 DIAGNOSIS — B9789 Other viral agents as the cause of diseases classified elsewhere: Secondary | ICD-10-CM

## 2018-07-20 DIAGNOSIS — J069 Acute upper respiratory infection, unspecified: Secondary | ICD-10-CM

## 2018-07-20 MED ORDER — HYDROCODONE-HOMATROPINE 5-1.5 MG/5ML PO SYRP: 5.0000 mL | ORAL_SOLUTION | Freq: Three times a day (TID) | ORAL | 0 refills | Status: DC | PRN

## 2018-07-20 NOTE — Progress Notes (Signed)
  Summer Robinson is a 48 y.o. female with the following history as recorded in EpicCare:  Patient Active Problem List   Diagnosis Date Noted  . BMI 30.0-30.9,adult 03/01/2018  . Trigger finger 03/01/2018  . Acute bursitis of right shoulder 01/26/2018    Current Outpatient Medications  Medication Sig Dispense Refill  . guaiFENesin (MUCINEX) 600 MG 12 hr tablet Take 600 mg by mouth 2 (two) times daily.    Marland Kitchen HYDROcodone-homatropine (HYCODAN) 5-1.5 MG/5ML syrup Take 5 mLs by mouth every 8 (eight) hours as needed for cough. 120 mL 0   No current facility-administered medications for this visit.     Allergies: Patient has no known allergies.  Past Medical History:  Diagnosis Date  . GERD (gastroesophageal reflux disease)   . Hay fever     History reviewed. No pertinent surgical history.  Family History  Problem Relation Age of Onset  . Diabetes Mother   . High blood pressure Mother   . High Cholesterol Mother   . Miscarriages / [place] Mother   . Cancer Father     Social History   Tobacco Use  . Smoking status: Never Smoker  . Smokeless tobacco: Never Used  Substance Use Topics  . Alcohol use: Yes    Comment: Socially      Subjective:  Mrs Christoffersen is here today requesting evaluation of acute complaint of cough/cold symptoms, which first began about 3 days ago. She first developed malaise, fevers on Monday, ran fever until yesterday, but no fever today Then Tuesday began coughing, dry cough Reports: nasal congestion, sore throat, chest congestion Denies: syncope, confusion, body aches, headaches, chest pain, shortness of breath, wheezing, abdominal pain, nausea, vomiting, diarrhea Smoker? no Tried at home: mucinex Improvement/worsening since onset? Feels a little better today Recent travel: no Sick contacts: no  ROS- See HPI  Objective:  Vitals:   07/20/18 1111  BP: 132/90  Pulse: 76  Temp: 98.8 F (37.1 C)  TempSrc: Oral  SpO2: 97%  Weight: 177 lb 1.9  oz (80.3 kg)    General: Well developed, well nourished, in no acute distress  Skin : Warm and dry.  Head: Normocephalic and atraumatic  Eyes: Sclera and conjunctiva clear; pupils round and reactive to light; extraocular movements intact  Ears: External normal; canals clear; tympanic membranes bulging bilaterally without erythema or effusion  Oropharynx: Pink, supple. No suspicious lesions  Neck: Supple without thyromegaly, adenopathy  Lungs: Respirations unlabored; clear to auscultation bilaterally without wheeze, rales; dry cough during exam CVS exam: normal rate and regular rhythm, S1 and S2 normal.  Extremities: No edema, cyanosis, clubbing  Vessels: Symmetric bilaterally  Neurologic: Alert and oriented; speech intact; face symmetrical; moves all extremities well; CNII-XII intact without focal deficit  Psychiatric: Normal mood and affect.  Assessment:  1. Viral URI with cough     Plan:   Suspect symptoms are viral at this point which we discussed Hycodan Rx sent for cough- dosing, side effects discussed Home management, OTC medications, red flags and return precautions including when to seek immediate care discussed and printed on AVS She will f/u for new, worsening symptoms or if symptoms persist >1 week  No follow-ups on file.  No orders of the defined types were placed in this encounter.   Requested Prescriptions   Signed Prescriptions Disp Refills  . HYDROcodone-homatropine (HYCODAN) 5-1.5 MG/5ML syrup 120 mL 0    Sig: Take 5 mLs by mouth every 8 (eight) hours as needed for cough.

## 2018-07-20 NOTE — Patient Instructions (Signed)
Hycodan as needed for cough. This medication can cause drowsiness. Please do not drink alcohol or operate machinery when you take this medication.  For nasal and head congestion may take Sudafed PE 10 mg every 4 hour as needed. Saline nasal spray used frequently. For drainage may use Allegra, Claritin or Zyrtec. If you need stronger medicine to stop drainage may take Chlor-Trimeton 2-4 mg every 4 hours. This may cause drowsiness. Ibuprofen 600 mg every 6 hours as needed for pain, discomfort or fever. Drink plenty of fluids and stay well-hydrated.  Please follow up for fevers over 101, if your symptoms get worse, or if your symptoms dont get better in 1 week    Upper Respiratory Infection, Adult An upper respiratory infection (URI) affects the nose, throat, and upper air passages. URIs are caused by germs (viruses). The most common type of URI is often called "the common cold." Medicines cannot cure URIs, but you can do things at home to relieve your symptoms. URIs usually get better within 7-10 days. Follow these instructions at home: Activity  Rest as needed.  If you have a fever, stay home from work or school until your fever is gone, or until your doctor says you may return to work or school. ? You should stay home until you cannot spread the infection anymore (you are not contagious). ? Your doctor may have you wear a face mask so you have less risk of spreading the infection. Relieving symptoms  Gargle with a salt-water mixture 3-4 times a day or as needed. To make a salt-water mixture, completely dissolve -1 tsp of salt in 1 cup of warm water.  Use a cool-mist humidifier to add moisture to the air. This can help you breathe more easily. Eating and drinking   Drink enough fluid to keep your pee (urine) pale yellow.  Eat soups and other clear broths. General instructions   Take over-the-counter and prescription medicines only as told by your doctor. These include cold  medicines, fever reducers, and cough suppressants.  Do not use any products that contain nicotine or tobacco. These include cigarettes and e-cigarettes. If you need help quitting, ask your doctor.  Avoid being where people are smoking (avoid secondhand smoke).  Make sure you get regular shots and get the flu shot every year.  Keep all follow-up visits as told by your doctor. This is important. How to avoid spreading infection to others   Wash your hands often with soap and water. If you do not have soap and water, use hand sanitizer.  Avoid touching your mouth, face, eyes, or nose.  Cough or sneeze into a tissue or your sleeve or elbow. Do not cough or sneeze into your hand or into the air. Contact a doctor if:  You are getting worse, not better.  You have any of these: ? A fever. ? Chills. ? Brown or red mucus in your nose. ? Yellow or brown fluid (discharge)coming from your nose. ? Pain in your face, especially when you bend forward. ? Swollen neck glands. ? Pain with swallowing. ? White areas in the back of your throat. Get help right away if:  You have shortness of breath that gets worse.  You have very bad or constant: ? Headache. ? Ear pain. ? Pain in your forehead, behind your eyes, and over your cheekbones (sinus pain). ? Chest pain.  You have long-lasting (chronic) lung disease along with any of these: ? Wheezing. ? Long-lasting cough. ? Coughing up blood. ?  A change in your usual mucus.  You have a stiff neck.  You have changes in your: ? Vision. ? Hearing. ? Thinking. ? Mood. Summary  An upper respiratory infection (URI) is caused by a germ called a virus. The most common type of URI is often called "the common cold."  URIs usually get better within 7-10 days.  Take over-the-counter and prescription medicines only as told by your doctor. This information is not intended to replace advice given to you by your health care provider. Make sure you  discuss any questions you have with your health care provider. Document Released: 10/27/2007 Document Revised: 12/31/2016 Document Reviewed: 12/31/2016 Elsevier Interactive Patient Education  2019 [employer].

## 2018-11-01 ENCOUNTER — Encounter: Payer: Self-pay | Admitting: Family Medicine

## 2018-12-03 ENCOUNTER — Encounter: Payer: Self-pay | Admitting: Family Medicine

## 2018-12-14 ENCOUNTER — Other Ambulatory Visit: Payer: Self-pay

## 2018-12-14 ENCOUNTER — Encounter: Payer: Self-pay | Admitting: Internal Medicine

## 2018-12-14 ENCOUNTER — Ambulatory Visit (INDEPENDENT_AMBULATORY_CARE_PROVIDER_SITE_OTHER): Payer: BC Managed Care – PPO | Admitting: Internal Medicine

## 2018-12-14 ENCOUNTER — Encounter: Payer: BC Managed Care – PPO | Admitting: Internal Medicine

## 2018-12-14 VITALS — BP 136/88 | HR 80 | Temp 98.4°F | Ht 62.0 in | Wt 183.0 lb

## 2018-12-14 DIAGNOSIS — Z0001 Encounter for general adult medical examination with abnormal findings: Secondary | ICD-10-CM

## 2018-12-14 DIAGNOSIS — E611 Iron deficiency: Secondary | ICD-10-CM | POA: Diagnosis not present

## 2018-12-14 DIAGNOSIS — R7303 Prediabetes: Secondary | ICD-10-CM | POA: Diagnosis not present

## 2018-12-14 DIAGNOSIS — Z0000 Encounter for general adult medical examination without abnormal findings: Secondary | ICD-10-CM | POA: Insufficient documentation

## 2018-12-14 DIAGNOSIS — Z20828 Contact with and (suspected) exposure to other viral communicable diseases: Secondary | ICD-10-CM

## 2018-12-14 DIAGNOSIS — Z20822 Contact with and (suspected) exposure to covid-19: Secondary | ICD-10-CM

## 2018-12-14 DIAGNOSIS — R202 Paresthesia of skin: Secondary | ICD-10-CM

## 2018-12-14 DIAGNOSIS — J069 Acute upper respiratory infection, unspecified: Secondary | ICD-10-CM

## 2018-12-14 DIAGNOSIS — E559 Vitamin D deficiency, unspecified: Secondary | ICD-10-CM

## 2018-12-14 NOTE — Progress Notes (Signed)
Subjective:    Patient ID: Summer Robinson, female    DOB: Jan 11, 1971, 48 y.o.   MRN: [id number]  HPI  Here for wellness and f/u;  Overall doing ok;  Pt denies Chest pain, worsening SOB, DOE, wheezing, orthopnea, PND, worsening LE edema, palpitations, dizziness or syncope.  Pt denies neurological change such as new headache, facial or extremity weakness.  Pt denies polydipsia, polyuria, or low sugar symptoms. Pt states overall good compliance with treatment and medications, good tolerability, and has been trying to follow appropriate diet.  Pt denies worsening depressive symptoms, suicidal ideation or panic. No fever, night sweats, wt loss, loss of appetite, or other constitutional symptoms.  Pt states good ability with ADL's, has low fall risk, home safety reviewed and adequate, no other significant changes in hearing or vision, and only occasionally active with exercise.  Has gained significant wt. Wt Readings from Last 3 Encounters:  12/14/18 183 lb (83 kg)  07/20/18 177 lb 1.9 oz (80.3 kg)  04/28/18 170 lb (77.1 kg)  Also had a URI in feb 2020 and in retrospect was quite ill, now resolved, but asking for COVID ab testing. 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 Past Medical History:  Diagnosis Date  . GERD (gastroesophageal reflux disease)   . Hay fever    No past surgical history on file.  reports that she has never smoked. She has never used smokeless tobacco. She reports current alcohol use. She reports that she does not use drugs. family history includes Cancer in her father; Diabetes in her mother; High Cholesterol in her mother; High blood pressure in her mother; Miscarriages / Stillbirths in her mother. No Known Allergies Current Outpatient Medications on File Prior to Visit  Medication Sig Dispense  Refill  . guaiFENesin (MUCINEX) 600 MG 12 hr tablet Take 600 mg by mouth 2 (two) times daily.    Marland Kitchen HYDROcodone-homatropine (HYCODAN) 5-1.5 MG/5ML syrup Take 5 mLs by mouth every 8 (eight) hours as needed for cough. 120 mL 0   No current facility-administered medications on file prior to visit.    Review of Systems Constitutional: Negative for other unusual diaphoresis, sweats, appetite or weight changes HENT: Negative for other worsening hearing loss, ear pain, facial swelling, mouth sores or neck stiffness.   Eyes: Negative for other worsening pain, redness or other visual disturbance.  Respiratory: Negative for other stridor or swelling Cardiovascular: Negative for other palpitations or other chest pain  Gastrointestinal: Negative for worsening diarrhea or loose stools, blood in stool, distention or other pain Genitourinary: Negative for hematuria, flank pain or other change in urine volume.  Musculoskeletal: Negative for myalgias or other joint swelling.  Skin: Negative for other color change, or other wound or worsening drainage.  Neurological: Negative for other syncope or numbness. Hematological: Negative for other adenopathy or swelling Psychiatric/Behavioral: Negative for hallucinations, other worsening agitation, SI, self-injury, or new decreased concentration All other system neg per pt    Objective:   Physical Exam BP 136/88   Pulse 80   Temp 98.4 F (36.9 C) (Oral)   Ht 5\' 2"  (1.575 m)   Wt 183 lb (83 kg)   SpO2 99%   BMI 33.47 kg/m  VS noted,  Constitutional: Pt is oriented to person, place, and time. Appears well-developed and  well-nourished, in no significant distress and comfortable Head: Normocephalic and atraumatic  Eyes: Conjunctivae and EOM are normal. Pupils are equal, round, and reactive to light Right Ear: External ear normal without discharge Left Ear: External ear normal without discharge Nose: Nose without discharge or deformity Mouth/Throat:  Oropharynx is without other ulcerations and moist  Neck: Normal range of motion. Neck supple. No JVD present. No tracheal deviation present or significant neck LA or mass Cardiovascular: Normal rate, regular rhythm, normal heart sounds and intact distal pulses.   Pulmonary/Chest: WOB normal and breath sounds without rales or wheezing  Abdominal: Soft. Bowel sounds are normal. NT. No HSM  Musculoskeletal: Normal range of motion. Exhibits no edema Lymphadenopathy: Has no other cervical adenopathy.  Neurological: Pt is alert and oriented to person, place, and time. Pt has normal reflexes. No cranial nerve deficit. Motor grossly intact, Gait intact Skin: Skin is warm and dry. No rash noted or new ulcerations Psychiatric:  Has normal mood and affect. Behavior is normal without agitation No other exam findings Lab Results  Component Value Date   WBC 4.3 03/01/2018   HGB 12.6 03/01/2018   HCT 38.9 03/01/2018   PLT 187.0 03/01/2018   GLUCOSE 96 03/01/2018   CHOL 176 03/01/2018   TRIG 99.0 03/01/2018   HDL 75.50 03/01/2018   LDLCALC 81 03/01/2018   ALT 13 03/01/2018   AST 20 03/01/2018   NA 139 03/01/2018   K 3.7 03/01/2018   CL 103 03/01/2018   CREATININE 0.79 03/01/2018   BUN 15 03/01/2018   CO2 27 03/01/2018   TSH 1.65 03/01/2018   HGBA1C 5.9 03/01/2018       Assessment & Plan:

## 2018-12-14 NOTE — Assessment & Plan Note (Signed)
[place] for covid ab testing,  to f/u any worsening symptoms or concerns

## 2018-12-14 NOTE — Patient Instructions (Signed)
Please wear the wrist splints at night until the hands are improved  Please continue all other medications as before, and refills have been done if requested.  Please have the pharmacy call with any other refills you may need.  Please continue your efforts at being more active, low cholesterol diet, and weight control.  You are otherwise up to date with prevention measures today.  Please keep your appointments with your specialists as you may have planned  You will be contacted regarding the referral for: GYN  Please go to the LAB in the Basement (turn left off the elevator) for the tests to be done tomorrow  You will be contacted by phone if any changes need to be made immediately.  Otherwise, you will receive a letter about your results with an explanation, but please check with MyChart first.  Please remember to sign up for MyChart if you have not done so, as this will be important to you in the future with finding out test results, communicating by private email, and scheduling acute appointments online when needed.  Please return in 1 year for your yearly visit, or sooner if needed, with Lab testing done 3-5 days before

## 2018-12-14 NOTE — Assessment & Plan Note (Signed)

## 2018-12-14 NOTE — Assessment & Plan Note (Signed)
C/w bilat cts mild, for bilat wrist splints at night

## 2018-12-14 NOTE — Assessment & Plan Note (Signed)
stable overall by history and exam, recent data reviewed with pt, and pt to continue medical treatment as before,  to f/u any worsening symptoms or concerns  

## 2018-12-15 ENCOUNTER — Other Ambulatory Visit (INDEPENDENT_AMBULATORY_CARE_PROVIDER_SITE_OTHER): Payer: BC Managed Care – PPO

## 2018-12-15 DIAGNOSIS — Z0000 Encounter for general adult medical examination without abnormal findings: Secondary | ICD-10-CM | POA: Diagnosis not present

## 2018-12-15 DIAGNOSIS — R202 Paresthesia of skin: Secondary | ICD-10-CM

## 2018-12-15 DIAGNOSIS — E559 Vitamin D deficiency, unspecified: Secondary | ICD-10-CM

## 2018-12-15 DIAGNOSIS — R7303 Prediabetes: Secondary | ICD-10-CM

## 2018-12-15 DIAGNOSIS — E611 Iron deficiency: Secondary | ICD-10-CM | POA: Diagnosis not present

## 2018-12-15 LAB — URINALYSIS, ROUTINE W REFLEX MICROSCOPIC
Bilirubin Urine: NEGATIVE
Hgb urine dipstick: NEGATIVE
Ketones, ur: NEGATIVE
Leukocytes,Ua: NEGATIVE
Nitrite: NEGATIVE
RBC / HPF: NONE SEEN (ref 0–?)
Specific Gravity, Urine: 1.02 (ref 1.000–1.030)
Total Protein, Urine: NEGATIVE
Urine Glucose: NEGATIVE
Urobilinogen, UA: 0.2 (ref 0.0–1.0)
WBC, UA: NONE SEEN (ref 0–?)
pH: 5.5 (ref 5.0–8.0)

## 2018-12-15 LAB — HEPATIC FUNCTION PANEL
ALT: 23 U/L (ref 0–35)
AST: 23 U/L (ref 0–37)
Albumin: 4.2 g/dL (ref 3.5–5.2)
Alkaline Phosphatase: 73 U/L (ref 39–117)
Bilirubin, Direct: 0.1 mg/dL (ref 0.0–0.3)
Total Bilirubin: 0.4 mg/dL (ref 0.2–1.2)
Total Protein: 7.8 g/dL (ref 6.0–8.3)

## 2018-12-15 LAB — LIPID PANEL
Cholesterol: 177 mg/dL (ref 0–200)
HDL: 62 mg/dL (ref >39.00)
LDL Cholesterol: 92 mg/dL (ref 0–99)
NonHDL: 114.8
Total CHOL/HDL Ratio: 3
Triglycerides: 113 mg/dL (ref 0.0–149.0)
VLDL: 22.6 mg/dL (ref 0.0–40.0)

## 2018-12-15 LAB — CBC WITH DIFFERENTIAL/PLATELET
Basophils Absolute: 0 10*3/uL (ref 0.0–0.1)
Basophils Relative: 0.9 % (ref 0.0–3.0)
Eosinophils Absolute: 0.1 10*3/uL (ref 0.0–0.7)
Eosinophils Relative: 1.6 % (ref 0.0–5.0)
HCT: 36.1 % (ref 36.0–46.0)
Hemoglobin: 11.8 g/dL — ABNORMAL LOW (ref 12.0–15.0)
Lymphocytes Relative: 48.7 % — ABNORMAL HIGH (ref 12.0–46.0)
Lymphs Abs: 1.9 10*3/uL (ref 0.7–4.0)
MCHC: 32.8 g/dL (ref 30.0–36.0)
MCV: 91.1 fl (ref 78.0–100.0)
Monocytes Absolute: 0.3 10*3/uL (ref 0.1–1.0)
Monocytes Relative: 8.5 % (ref 3.0–12.0)
Neutro Abs: 1.6 10*3/uL (ref 1.4–7.7)
Neutrophils Relative %: 40.3 % — ABNORMAL LOW (ref 43.0–77.0)
Platelets: 213 10*3/uL (ref 150.0–400.0)
RBC: 3.97 Mil/uL (ref 3.87–5.11)
RDW: 14.5 % (ref 11.5–15.5)
WBC: 3.9 10*3/uL — ABNORMAL LOW (ref 4.0–10.5)

## 2018-12-15 LAB — IBC PANEL
Iron: 68 ug/dL (ref 42–145)
Saturation Ratios: 17.3 % — ABNORMAL LOW (ref 20.0–50.0)
Transferrin: 281 mg/dL (ref 212.0–360.0)

## 2018-12-15 LAB — VITAMIN D 25 HYDROXY (VIT D DEFICIENCY, FRACTURES): VITD: 22.11 ng/mL — ABNORMAL LOW (ref 30.00–100.00)

## 2018-12-15 LAB — BASIC METABOLIC PANEL
BUN: 10 mg/dL (ref 6–23)
CO2: 27 mEq/L (ref 19–32)
Calcium: 9.7 mg/dL (ref 8.4–10.5)
Chloride: 104 mEq/L (ref 96–112)
Creatinine, Ser: 0.81 mg/dL (ref 0.40–1.20)
GFR: 91.31 mL/min (ref >60.00)
Glucose, Bld: 89 mg/dL (ref 70–99)
Potassium: 4.2 mEq/L (ref 3.5–5.1)
Sodium: 137 mEq/L (ref 135–145)

## 2018-12-15 LAB — VITAMIN B12: Vitamin B-12: 436 pg/mL (ref 211–911)

## 2018-12-15 LAB — HEMOGLOBIN A1C: Hgb A1c MFr Bld: 5.8 % (ref 4.6–6.5)

## 2018-12-15 LAB — TSH: TSH: 1.29 u[IU]/mL (ref 0.35–4.50)

## 2018-12-18 ENCOUNTER — Other Ambulatory Visit: Payer: Self-pay | Admitting: Internal Medicine

## 2018-12-18 MED ORDER — VITAMIN D (ERGOCALCIFEROL) 1.25 MG (50000 UNIT) PO CAPS: 50000.0000 [IU] | ORAL_CAPSULE | ORAL | 0 refills | Status: DC

## 2018-12-25 ENCOUNTER — Encounter: Payer: Self-pay | Admitting: Internal Medicine

## 2018-12-25 MED ORDER — IBUPROFEN 800 MG PO TABS: 800.0000 mg | ORAL_TABLET | Freq: Three times a day (TID) | ORAL | 0 refills | Status: DC | PRN

## 2018-12-25 NOTE — Telephone Encounter (Signed)
Staff to please contact lab - we have not yet seen COVID testing that was orderd July 23 (but not sure when exactly was done)

## 2018-12-29 ENCOUNTER — Encounter: Payer: Self-pay | Admitting: Internal Medicine

## 2019-01-01 ENCOUNTER — Telehealth: Payer: Self-pay

## 2019-01-01 ENCOUNTER — Other Ambulatory Visit: Payer: Self-pay | Admitting: Internal Medicine

## 2019-01-01 ENCOUNTER — Telehealth: Payer: Self-pay | Admitting: Internal Medicine

## 2019-01-01 DIAGNOSIS — Z20828 Contact with and (suspected) exposure to other viral communicable diseases: Secondary | ICD-10-CM

## 2019-01-01 DIAGNOSIS — Z20822 Contact with and (suspected) exposure to covid-19: Secondary | ICD-10-CM

## 2019-01-01 NOTE — Telephone Encounter (Signed)
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

## 2019-01-01 NOTE — Telephone Encounter (Signed)
I do not see a result as well, though this was ordered July 23  I can reorder and she can go to [facility] site for testing if our lab says they do not have the result (we may need to call them)

## 2019-01-01 NOTE — Telephone Encounter (Signed)
Please advise. I do not see a result.   Copied from [place] [phone]. Topic: General - Other >> Dec 29, 2018  2:56 PM Rainey Pines A wrote: Patient needs callback from nurse in regards to her covid antibody results.

## 2019-01-01 NOTE — Telephone Encounter (Signed)
Very sorry, I do not understand the numbers that were referred to  This may have been the B12 or Vit D labs?   Please direct pt to [facility] Bigness for explanation of reason for labs and how were coded. thanks

## 2019-01-01 NOTE — Telephone Encounter (Signed)
Patient states her insurance company informed her that she was not to have labs [phone] and [phone] done this last round.  Patient is requesting call back in regard as to why Dr. Jenny Reichmann ordered those labs.

## 2019-01-01 NOTE — Telephone Encounter (Signed)
Patient wanted to know why A1C was checked on 12/15/18 when it was lasted checked on 02/10/19?  States that [facility] [place] had stated she was prediabetic.  States Dr. Jenny Reichmann told her before the labs were ordered that she was not prediabetic.  Therefore, patient does not understand why this was ordered so soon?

## 2019-01-01 NOTE — Telephone Encounter (Signed)
Pt will come by to have lab drawn one day this week.

## 2019-01-02 NOTE — Telephone Encounter (Signed)
Left pt vm to call back in regard.

## 2019-02-14 LAB — HM MAMMOGRAPHY

## 2019-03-01 ENCOUNTER — Encounter: Payer: Self-pay | Admitting: Internal Medicine

## 2019-03-20 ENCOUNTER — Encounter: Payer: Self-pay | Admitting: Internal Medicine

## 2019-04-27 ENCOUNTER — Encounter: Payer: Self-pay | Admitting: Gastroenterology

## 2019-06-10 ENCOUNTER — Encounter: Payer: Self-pay | Admitting: Internal Medicine

## 2019-06-11 ENCOUNTER — Ambulatory Visit: Payer: BC Managed Care – PPO | Attending: Internal Medicine

## 2019-07-19 ENCOUNTER — Ambulatory Visit: Payer: BC Managed Care – PPO | Attending: Internal Medicine

## 2019-07-19 DIAGNOSIS — Z23 Encounter for immunization: Secondary | ICD-10-CM | POA: Insufficient documentation

## 2019-07-19 NOTE — Progress Notes (Signed)
   Covid-19 Vaccination Clinic  Name:  Summer Robinson    MRN: [id number] DOB: 1971/04/17  07/19/2019  [name] was observed post Covid-19 immunization for 15 minutes without incidence. She was provided with Vaccine Information Sheet and instruction to access the V-Safe system.   [name] was instructed to call 911 with any severe reactions post vaccine: Marland Kitchen Difficulty breathing  . Swelling of your face and throat  . A fast heartbeat  . A bad rash all over your body  . Dizziness and weakness    Immunizations Administered    Name Date Dose VIS Date Route   Pfizer COVID-19 Vaccine 07/19/2019 10:50 AM 0.3 mL 05/04/2019 Intramuscular   Manufacturer: [place]   Lot: [id number]   [place]: [id number]

## 2019-08-14 ENCOUNTER — Ambulatory Visit: Payer: BC Managed Care – PPO | Attending: Internal Medicine

## 2019-08-14 DIAGNOSIS — Z23 Encounter for immunization: Secondary | ICD-10-CM

## 2019-08-14 NOTE — Progress Notes (Signed)
   Covid-19 Vaccination Clinic  Name:  Jeannene Monjaraz    MRN: [id number] DOB: 06-21-1970  08/14/2019  Ms. Clendenen was observed post Covid-19 immunization for 15 minutes without incident. She was provided with Vaccine Information Sheet and instruction to access the V-Safe system.   Ms. Deberry was instructed to call 911 with any severe reactions post vaccine: Marland Kitchen Difficulty breathing  . Swelling of face and throat  . A fast heartbeat  . A bad rash all over body  . Dizziness and weakness   Immunizations Administered    Name Date Dose VIS Date Route   Pfizer COVID-19 Vaccine 08/14/2019  9:05 AM 0.3 mL 05/04/2019 Intramuscular   Manufacturer: [place]   Lot: [id number]   [place]: [id number]

## 2019-12-25 ENCOUNTER — Other Ambulatory Visit: Payer: Self-pay | Admitting: Internal Medicine

## 2019-12-26 MED ORDER — IBUPROFEN 800 MG PO TABS: 800.0000 mg | ORAL_TABLET | Freq: Three times a day (TID) | ORAL | 0 refills | Status: DC | PRN

## 2020-04-29 NOTE — Progress Notes (Signed)
   I, Wendy Poet, LAT, ATC, am serving as scribe for Dr. Lynne Leader.  Summer Robinson is a 49 y.o. female who presents to [place] at [facility] today for f/u of chronic R shoulder pain.  She was last seen by Dr. Tamala Julian on 01/26/18 w/ c/o R ant shoulder pain and numbness and tingling in her R UE.  She was provided w/ a HEP and advised to use Pennsaid and Vit D.  Since her last visit, pt reports that her R shoulder has been bothering her for a "long time."  She locates her pain to her R ant shoulder but is now having radiating pain into her R upper arm.  Aggravating factors include lateral deltoid raises, dips and push-ups.   Pertinent review of systems: No fevers or chills  Relevant historical information: Prediabetes   Exam:  BP 130/86 (BP Location: Right Arm, Patient Position: Sitting, Cuff Size: Normal)   Pulse 77   Ht 5\' 2"  (1.575 m)   Wt 155 lb 3.2 oz (70.4 kg)   SpO2 98%   BMI 28.39 kg/m  General: Well Developed, well nourished, and in no acute distress.   MSK: Right shoulder Normal. Normal motion. Intact strength. Positive Hawkins and Neer's test. Positive empty can test. Negative Yergason's and speeds test.  C-spine normal. Nontender previously normal cervical motion some pain with extension. Extremity strength reflexes and sensation are equal and normal throughout.    Lab and Radiology Results Right shoulder x-ray ordered not completed  Prior ultrasound right shoulder 2019 showed subacromial bursitis changes    Assessment and Plan: 49 y.o. female with right shoulder pain ongoing for several years occurring off and on.  Pain occurs with light lifting.  Symptom and physical exam consistent with impingement.  Discussed options.  Plan for physical therapy.  Physical therapy ordered for [facility].  However patient notes that she works in [place] and is considering physical therapy location [place] but is not sure where she would like to go.   We may change the physical therapy location to one more suitable for where she works at [employer].  She will let us know.  If not improved with physical therapy would consider either MRI or repeat ultrasound and possible injection.  Right now she notes her symptoms are not bad enough that she would like to have injection.   PDMP not reviewed this encounter. Orders Placed This Encounter  Procedures  . DG Shoulder Right    Standing Status:   Future    Standing Expiration Date:   04/30/2021    Order Specific Question:   Reason for Exam (SYMPTOM  OR DIAGNOSIS REQUIRED)    Answer:   eval shouler pain    Order Specific Question:   Is patient pregnant?    Answer:   No    Order Specific Question:   Preferred imaging location?    Answer:   [facility]  . Ambulatory referral to Physical Therapy    Referral Priority:   Routine    Referral Type:   Physical Medicine    Referral Reason:   Specialty Services Required    Requested Specialty:   Physical Therapy   No orders of the defined types were placed in this encounter.    Discussed warning signs or symptoms. Please see discharge instructions. Patient expresses understanding.   The above documentation has been reviewed and is accurate and complete Lynne Leader, M.D.

## 2020-04-30 ENCOUNTER — Ambulatory Visit: Payer: Self-pay

## 2020-04-30 ENCOUNTER — Other Ambulatory Visit: Payer: Self-pay

## 2020-04-30 ENCOUNTER — Ambulatory Visit: Payer: BC Managed Care – PPO | Admitting: Family Medicine

## 2020-04-30 ENCOUNTER — Encounter: Payer: Self-pay | Admitting: Family Medicine

## 2020-04-30 VITALS — BP 130/86 | HR 77 | Ht 62.0 in | Wt 155.2 lb

## 2020-04-30 DIAGNOSIS — G8929 Other chronic pain: Secondary | ICD-10-CM

## 2020-04-30 DIAGNOSIS — M25511 Pain in right shoulder: Secondary | ICD-10-CM | POA: Diagnosis not present

## 2020-04-30 NOTE — Patient Instructions (Signed)
Thank you for coming in today.  Please get an Xray today before you leave  I've referred you to Physical Therapy.  Let us know if you don't hear from them in one week.  Pick a location in [place] and let me know which one you want. Otherwise we will use a local location.   If not better after a 6 weeks trial of PT either return or let me know and I can do [place] and possibly injection or order MRI.

## 2020-06-06 ENCOUNTER — Encounter: Payer: Self-pay | Admitting: Internal Medicine

## 2020-06-06 ENCOUNTER — Ambulatory Visit (INDEPENDENT_AMBULATORY_CARE_PROVIDER_SITE_OTHER): Payer: BC Managed Care – PPO | Admitting: Internal Medicine

## 2020-06-06 ENCOUNTER — Other Ambulatory Visit: Payer: Self-pay

## 2020-06-06 VITALS — BP 126/82 | HR 62 | Temp 98.0°F | Ht 62.0 in | Wt 156.0 lb

## 2020-06-06 DIAGNOSIS — E559 Vitamin D deficiency, unspecified: Secondary | ICD-10-CM | POA: Diagnosis not present

## 2020-06-06 DIAGNOSIS — Z0001 Encounter for general adult medical examination with abnormal findings: Secondary | ICD-10-CM

## 2020-06-06 DIAGNOSIS — R7303 Prediabetes: Secondary | ICD-10-CM | POA: Diagnosis not present

## 2020-06-06 DIAGNOSIS — Z1159 Encounter for screening for other viral diseases: Secondary | ICD-10-CM

## 2020-06-06 DIAGNOSIS — N951 Menopausal and female climacteric states: Secondary | ICD-10-CM | POA: Diagnosis not present

## 2020-06-06 DIAGNOSIS — Z0000 Encounter for general adult medical examination without abnormal findings: Secondary | ICD-10-CM

## 2020-06-06 LAB — URINALYSIS, ROUTINE W REFLEX MICROSCOPIC
Bilirubin Urine: NEGATIVE
Hgb urine dipstick: NEGATIVE
Ketones, ur: NEGATIVE
Nitrite: NEGATIVE
RBC / HPF: NONE SEEN (ref 0–?)
Specific Gravity, Urine: >=1.03 — AB (ref 1.000–1.030)
Total Protein, Urine: NEGATIVE
Urine Glucose: NEGATIVE
Urobilinogen, UA: 0.2 (ref 0.0–1.0)
pH: 5.5 (ref 5.0–8.0)

## 2020-06-06 LAB — CBC WITH DIFFERENTIAL/PLATELET
Basophils Absolute: 0 10*3/uL (ref 0.0–0.1)
Basophils Relative: 0.7 % (ref 0.0–3.0)
Eosinophils Absolute: 0.1 10*3/uL (ref 0.0–0.7)
Eosinophils Relative: 1.2 % (ref 0.0–5.0)
HCT: 40.2 % (ref 36.0–46.0)
Hemoglobin: 13.1 g/dL (ref 12.0–15.0)
Lymphocytes Relative: 46.8 % — ABNORMAL HIGH (ref 12.0–46.0)
Lymphs Abs: 1.9 10*3/uL (ref 0.7–4.0)
MCHC: 32.6 g/dL (ref 30.0–36.0)
MCV: 90.5 fl (ref 78.0–100.0)
Monocytes Absolute: 0.4 10*3/uL (ref 0.1–1.0)
Monocytes Relative: 10.1 % (ref 3.0–12.0)
Neutro Abs: 1.7 10*3/uL (ref 1.4–7.7)
Neutrophils Relative %: 41.2 % — ABNORMAL LOW (ref 43.0–77.0)
Platelets: 219 10*3/uL (ref 150.0–400.0)
RBC: 4.45 Mil/uL (ref 3.87–5.11)
RDW: 16.5 % — ABNORMAL HIGH (ref 11.5–15.5)
WBC: 4.1 10*3/uL (ref 4.0–10.5)

## 2020-06-06 LAB — BASIC METABOLIC PANEL
BUN: 11 mg/dL (ref 6–23)
CO2: 28 mEq/L (ref 19–32)
Calcium: 10.1 mg/dL (ref 8.4–10.5)
Chloride: 103 mEq/L (ref 96–112)
Creatinine, Ser: 0.94 mg/dL (ref 0.40–1.20)
GFR: 71.26 mL/min (ref >60.00)
Glucose, Bld: 87 mg/dL (ref 70–99)
Potassium: 3.9 mEq/L (ref 3.5–5.1)
Sodium: 139 mEq/L (ref 135–145)

## 2020-06-06 LAB — HEPATIC FUNCTION PANEL
ALT: 15 U/L (ref 0–35)
AST: 27 U/L (ref 0–37)
Albumin: 4.5 g/dL (ref 3.5–5.2)
Alkaline Phosphatase: 75 U/L (ref 39–117)
Bilirubin, Direct: 0.2 mg/dL (ref 0.0–0.3)
Total Bilirubin: 0.9 mg/dL (ref 0.2–1.2)
Total Protein: 8.4 g/dL — ABNORMAL HIGH (ref 6.0–8.3)

## 2020-06-06 LAB — LIPID PANEL
Cholesterol: 166 mg/dL (ref 0–200)
HDL: 83.4 mg/dL (ref >39.00)
LDL Cholesterol: 74 mg/dL (ref 0–99)
NonHDL: 83.06
Total CHOL/HDL Ratio: 2
Triglycerides: 46 mg/dL (ref 0.0–149.0)
VLDL: 9.2 mg/dL (ref 0.0–40.0)

## 2020-06-06 LAB — FOLLICLE STIMULATING HORMONE: FSH: 80.5 m[IU]/mL

## 2020-06-06 LAB — TSH: TSH: 1.41 u[IU]/mL (ref 0.35–4.50)

## 2020-06-06 LAB — VITAMIN D 25 HYDROXY (VIT D DEFICIENCY, FRACTURES): VITD: 34.46 ng/mL (ref 30.00–100.00)

## 2020-06-06 LAB — HEMOGLOBIN A1C: Hgb A1c MFr Bld: 5.7 % (ref 4.6–6.5)

## 2020-06-06 MED ORDER — SERTRALINE HCL 50 MG PO TABS: 50.0000 mg | ORAL_TABLET | Freq: Every day | ORAL | 3 refills | Status: DC

## 2020-06-06 MED ORDER — IBUPROFEN 800 MG PO TABS: 800.0000 mg | ORAL_TABLET | Freq: Three times a day (TID) | ORAL | 3 refills | Status: DC | PRN

## 2020-06-06 NOTE — Patient Instructions (Signed)
Please take all new medication as prescribed - the zoloft 50 mg   Please call in a few weeks if you would want the 100 mg zoloft  Please continue all other medications as before, and refills have been done if requested.  Please have the pharmacy call with any other refills you may need.  Please continue your efforts at being more active, low cholesterol diet, and weight control.  You are otherwise up to date with prevention measures today.  Please keep your appointments with your specialists as you may have planned  Please go to the LAB at the blood drawing area for the tests to be done  You will be contacted by phone if any changes need to be made immediately.  Otherwise, you will receive a letter about your results with an explanation, but please check with MyChart first.  Please remember to sign up for MyChart if you have not done so, as this will be important to you in the future with finding out test results, communicating by private email, and scheduling acute appointments online when needed.  Please make an Appointment to return for your 1 year visit, or sooner if needed

## 2020-06-07 ENCOUNTER — Encounter: Payer: Self-pay | Admitting: Internal Medicine

## 2020-06-07 NOTE — Assessment & Plan Note (Signed)
Lab Results  Component Value Date   HGBA1C 5.7 06/06/2020  stable, to continue diet, wt control

## 2020-06-07 NOTE — Assessment & Plan Note (Signed)
Likely menopause, for [facility], also zoloft 50 qd

## 2020-06-07 NOTE — Assessment & Plan Note (Signed)

## 2020-06-07 NOTE — Progress Notes (Addendum)
Established Patient Office Visit  Subjective:  Patient ID: Summer Robinson, female    DOB: 03-03-1971  Age: 50 y.o. MRN: [id number]       Chief Complaint:: wellness exam and hot flashes, low vit D and hyperglycemia       HPI:  Summer Robinson is a 51 y.o. female here for wellness exam and c/o 3 mo worsening recurring hot flashes after stopped menses, wondering if she is in menopause.   Overall lost 30 lbs intentionally! Wt Readings from Last 3 Encounters:  06/06/20 156 lb (70.8 kg)  04/30/20 155 lb 3.2 oz (70.4 kg)  12/14/18 183 lb (83 kg)   BP Readings from Last 3 Encounters:  06/06/20 126/82  04/30/20 130/86  12/14/18 136/88    Past Medical History:  Diagnosis Date  . GERD (gastroesophageal reflux disease)   . Hay fever    History reviewed. No pertinent surgical history.  reports that she has never smoked. She has never used smokeless tobacco. She reports current alcohol use. She reports that she does not use drugs. family history includes Cancer in her father; Diabetes in her mother; High Cholesterol in her mother; High blood pressure in her mother; Miscarriages / Stillbirths in her mother. No Known Allergies Current Outpatient Medications on File Prior to Visit  Medication Sig Dispense Refill  . Vitamin D, Ergocalciferol, (DRISDOL) 1.25 MG (50000 UT) CAPS capsule Take 1 capsule (50,000 Units total) by mouth every 7 (seven) days. 12 capsule 0   No current facility-administered medications on file prior to visit.        ROS:  All others reviewed and negative.  Objective        PE:  BP 126/82   Pulse 62   Temp 98 F (36.7 C) (Oral)   Ht 5\' 2"  (1.575 m)   Wt 156 lb (70.8 kg)   SpO2 98%   BMI 28.53 kg/m                 Constitutional: Pt appears in NAD               HENT: Head: NCAT.                Right Ear: External ear normal.                 Left Ear: External ear normal.                Eyes: . Pupils are equal, round, and reactive to light. Conjunctivae  and EOM are normal               Nose: without d/c or deformity               Neck: Neck supple. Gross normal ROM               Cardiovascular: Normal rate and regular rhythm.                 Pulmonary/Chest: Effort normal and breath sounds without rales or wheezing.                Abd:  Soft, NT, ND, + BS, no organomegaly               Neurological: Pt is alert. At baseline orientation, motor grossly intact               Skin: Skin is warm. No rashes, no other new lesions, LE edema - none  Psychiatric: Pt behavior is normal without agitation   Assessment/Plan:  Summer Robinson is a 50 y.o. Black or African American [2] female with  has a past medical history of GERD (gastroesophageal reflux disease) and Hay fever.    Assessment Plan  See notes Labs reviewed for each problem: Lab Results  Component Value Date   WBC 4.1 06/06/2020   HGB 13.1 06/06/2020   HCT 40.2 06/06/2020   PLT 219.0 06/06/2020   GLUCOSE 87 06/06/2020   CHOL 166 06/06/2020   TRIG 46.0 06/06/2020   HDL 83.40 06/06/2020   LDLCALC 74 06/06/2020   ALT 15 06/06/2020   AST 27 06/06/2020   NA 139 06/06/2020   K 3.9 06/06/2020   CL 103 06/06/2020   CREATININE 0.94 06/06/2020   BUN 11 06/06/2020   CO2 28 06/06/2020   TSH 1.41 06/06/2020   HGBA1C 5.7 06/06/2020    Micro: none  Cardiac tracings I have personally interpreted today:  none  Pertinent Radiological findings (summarize): none    Health Maintenance Due  Topic Date Due  . Hepatitis C Screening  Never done    There are no preventive care reminders to display for this patient.  Lab Results  Component Value Date   TSH 1.41 06/06/2020   Lab Results  Component Value Date   WBC 4.1 06/06/2020   HGB 13.1 06/06/2020   HCT 40.2 06/06/2020   MCV 90.5 06/06/2020   PLT 219.0 06/06/2020   Lab Results  Component Value Date   NA 139 06/06/2020   K 3.9 06/06/2020   CO2 28 06/06/2020   GLUCOSE 87 06/06/2020   BUN 11 06/06/2020    CREATININE 0.94 06/06/2020   BILITOT 0.9 06/06/2020   ALKPHOS 75 06/06/2020   AST 27 06/06/2020   ALT 15 06/06/2020   PROT 8.4 (H) 06/06/2020   ALBUMIN 4.5 06/06/2020   CALCIUM 10.1 06/06/2020   GFR 71.26 06/06/2020   Lab Results  Component Value Date   CHOL 166 06/06/2020   Lab Results  Component Value Date   HDL 83.40 06/06/2020   Lab Results  Component Value Date   LDLCALC 74 06/06/2020   Lab Results  Component Value Date   TRIG 46.0 06/06/2020   Lab Results  Component Value Date   CHOLHDL 2 06/06/2020   Lab Results  Component Value Date   HGBA1C 5.7 06/06/2020     Assessment & Plan:   Problem List Items Addressed This Visit      Medium   Vitamin D deficiency    Last vitamin D Lab Results  Component Value Date   VD25OH 34.46 06/06/2020  stable, continue vit d 2000 u qd      Relevant Orders   VITAMIN D 25 Hydroxy (Vit-D Deficiency, Fractures) (Completed)   Pre-diabetes    Lab Results  Component Value Date   HGBA1C 5.7 06/06/2020  stable, to continue diet, wt control      Relevant Orders   Hemoglobin A1c (Completed)     Low   Hot flashes due to menopause    Likely menopause, for FSH, also zoloft 50 qd      Relevant Orders   Follicle stimulating hormone (Completed)     Unprioritized   Encounter for well adult exam with abnormal findings - Primary    Overall doing well, age appropriate education and counseling updated, referrals for preventative services and immunizations addressed, dietary and smoking counseling addressed, most recent labs reviewed.  I have personally reviewed and have  noted:  1) the patient's medical and social history 2) The pt's use of alcohol, tobacco, and illicit drugs 3) The patient's current medications and supplements 4) Functional ability including ADL's, fall risk, home safety risk, hearing and visual impairment 5) Diet and physical activities 6) Evidence for depression or mood disorder 7) The patient's height,  weight, and BMI have been recorded in the chart  I have made referrals, and provided counseling and education based on review of the above       Relevant Orders   Lipid panel (Completed)   Hepatic function panel (Completed)   CBC with Differential/Platelet (Completed)   TSH (Completed)   Urinalysis, Routine w reflex microscopic (Completed)   Basic metabolic panel (Completed)    Other Visit Diagnoses    Need for hepatitis C screening test       Relevant Orders   Hepatitis C Antibody      Meds ordered this encounter  Medications  . ibuprofen (ADVIL) 800 MG tablet    Sig: Take 1 tablet (800 mg total) by mouth every 8 (eight) hours as needed.    Dispense:  30 tablet    Refill:  3  . sertraline (ZOLOFT) 50 MG tablet    Sig: Take 1 tablet (50 mg total) by mouth daily.    Dispense:  90 tablet    Refill:  3    Follow-up: Return in about 1 year (around 06/06/2021).   Cathlean Cower, MD 06/07/2020 9:09 PM [facility]

## 2020-06-07 NOTE — Assessment & Plan Note (Signed)
Last vitamin D Lab Results  Component Value Date   VD25OH 34.46 06/06/2020  stable, continue vit d 2000 u qd

## 2020-06-09 MED ORDER — FLUCONAZOLE 150 MG PO TABS: ORAL_TABLET | ORAL | 1 refills | Status: DC

## 2020-06-10 LAB — HEPATITIS C ANTIBODY
Hepatitis C Ab: NONREACTIVE
SIGNAL TO CUT-OFF: 0.01 (ref <1.00)

## 2020-08-04 ENCOUNTER — Encounter: Payer: Self-pay | Admitting: Internal Medicine

## 2020-08-15 ENCOUNTER — Telehealth (INDEPENDENT_AMBULATORY_CARE_PROVIDER_SITE_OTHER): Payer: BC Managed Care – PPO | Admitting: Internal Medicine

## 2020-08-15 ENCOUNTER — Ambulatory Visit: Payer: BC Managed Care – PPO | Admitting: Internal Medicine

## 2020-08-15 DIAGNOSIS — F988 Other specified behavioral and emotional disorders with onset usually occurring in childhood and adolescence: Secondary | ICD-10-CM | POA: Insufficient documentation

## 2020-08-15 DIAGNOSIS — M25561 Pain in right knee: Secondary | ICD-10-CM

## 2020-08-15 DIAGNOSIS — J309 Allergic rhinitis, unspecified: Secondary | ICD-10-CM | POA: Diagnosis not present

## 2020-08-15 DIAGNOSIS — I10 Essential (primary) hypertension: Secondary | ICD-10-CM

## 2020-08-15 DIAGNOSIS — M6530 Trigger finger, unspecified finger: Secondary | ICD-10-CM

## 2020-08-15 MED ORDER — TRIAMCINOLONE ACETONIDE 55 MCG/ACT NA AERO: 2.0000 | INHALATION_SPRAY | Freq: Every day | NASAL | 12 refills | Status: DC

## 2020-08-15 MED ORDER — CETIRIZINE HCL 10 MG PO TABS: 10.0000 mg | ORAL_TABLET | Freq: Every day | ORAL | 11 refills | Status: AC

## 2020-08-15 MED ORDER — LISDEXAMFETAMINE DIMESYLATE 30 MG PO CAPS: 30.0000 mg | ORAL_CAPSULE | Freq: Every day | ORAL | 0 refills | Status: DC

## 2020-08-16 ENCOUNTER — Encounter: Payer: Self-pay | Admitting: Internal Medicine

## 2020-08-16 NOTE — Assessment & Plan Note (Signed)
Ok for trial vyvanse 30 qd, also refer ADD clinic for formal evaluation and dx

## 2020-08-16 NOTE — Assessment & Plan Note (Signed)
Improved within a few days of low dose amlodipine 2.5 per pt, ok to continue and f/u next visit

## 2020-08-16 NOTE — Progress Notes (Signed)
Patient ID: Summer Robinson, female   DOB: Oct 17, 1970, 50 y.o.   MRN: [id number]  Virtual Visit via Video Note  I connected with Summer Robinson on 08/15/20 at  1:40 PM EDT by a video enabled telemedicine application and verified that I am speaking with the correct person using two identifiers.  Location of all participants today Patient: at home Provider: at office   I discussed the limitations of evaluation and management by telemedicine and the availability of in person appointments. The patient expressed understanding and agreed to proceed.  History of Present Illness: Here with c/o ongoing years of persistent attention issues, starting multiple tasks without finishing, and has family members with same being tx for ADD.  Wondering if she has the same thing as an adult.  Does have several wks ongoing nasal allergy symptoms with clearish congestion, itch and sneezing, without fever, pain, ST, cough, swelling or wheezing.  Also has intermittent right knee pain, mild without swelling, giveaways or falls, for > 6 mo.  Also has a persistent right trigger 4th finger, mild intermittent but does not necessarily want tx for now.  Also BP was quite elevated at [facility] yesterday, 172/105, so started on low dose amlodipine 2.5 mg and asked to f/u here.  F/u BP at home has been 133/86 today.  Pt denies chest pain, increased sob or doe, wheezing, orthopnea, PND, increased LE swelling, palpitations, dizziness or syncope.  Denies new worsenig focal neuro s/s.  Pt denies polydipsia, polyuria Past Medical History:  Diagnosis Date  . GERD (gastroesophageal reflux disease)   . Hay fever    No past surgical history on file.  reports that she has never smoked. She has never used smokeless tobacco. She reports current alcohol use. She reports that she does not use drugs. family history includes Cancer in her father; Diabetes in her mother; High Cholesterol in her mother; High blood pressure in her mother; Miscarriages  / Stillbirths in her mother. No Known Allergies Current Outpatient Medications on File Prior to Visit  Medication Sig Dispense Refill  . fluconazole (DIFLUCAN) 150 MG tablet 1 tab by mouth every 3 days as needed 2 tablet 1  . ibuprofen (ADVIL) 800 MG tablet Take 1 tablet (800 mg total) by mouth every 8 (eight) hours as needed. 30 tablet 3  . sertraline (ZOLOFT) 50 MG tablet Take 1 tablet (50 mg total) by mouth daily. 90 tablet 3  . Vitamin D, Ergocalciferol, (DRISDOL) 1.25 MG (50000 UT) CAPS capsule Take 1 capsule (50,000 Units total) by mouth every 7 (seven) days. 12 capsule 0   No current facility-administered medications on file prior to visit.    Observations/Objective: Alert, NAD, appropriate mood and affect, resps normal, cn 2-12 intact, moves all 4s, no visible rash or swelling Lab Results  Component Value Date   WBC 4.1 06/06/2020   HGB 13.1 06/06/2020   HCT 40.2 06/06/2020   PLT 219.0 06/06/2020   GLUCOSE 87 06/06/2020   CHOL 166 06/06/2020   TRIG 46.0 06/06/2020   HDL 83.40 06/06/2020   LDLCALC 74 06/06/2020   ALT 15 06/06/2020   AST 27 06/06/2020   NA 139 06/06/2020   K 3.9 06/06/2020   CL 103 06/06/2020   CREATININE 0.94 06/06/2020   BUN 11 06/06/2020   CO2 28 06/06/2020   TSH 1.41 06/06/2020   HGBA1C 5.7 06/06/2020   Assessment and Plan: See notes  Follow Up Instructions: See notes   I discussed the assessment and treatment plan with the patient.  The patient was provided an opportunity to ask questions and all were answered. The patient agreed with the plan and demonstrated an understanding of the instructions.   The patient was advised to call back or seek an in-person evaluation if the symptoms worsen or if the condition fails to improve as anticipate   Cathlean Cower, MD

## 2020-08-16 NOTE — Assessment & Plan Note (Signed)
Mild to mod, for zyrtec and nasacort asd,  to f/u any worsening symptoms or concerns 

## 2020-08-16 NOTE — Patient Instructions (Signed)
Please take all new medication as prescribed  You will be contacted regarding the referral for: ADD clinic, and sport medicine

## 2020-08-16 NOTE — Assessment & Plan Note (Signed)
Mild intermittent, ok for volt gel prn, consider f/u with sport medicine in this office

## 2020-08-16 NOTE — Assessment & Plan Note (Signed)
4th finger right hand- pt just wanted to mention to day, will consider hand surgury for now but just not ready for that at this time

## 2020-08-19 ENCOUNTER — Encounter: Payer: Self-pay | Admitting: Internal Medicine

## 2020-08-19 MED ORDER — BENZONATATE 200 MG PO CAPS: 200.0000 mg | ORAL_CAPSULE | Freq: Three times a day (TID) | ORAL | 0 refills | Status: DC | PRN

## 2020-08-20 NOTE — Progress Notes (Signed)
I, Summer Robinson, LAT, ATC, am serving as scribe for Dr. Lynne Leader.  Summer Robinson is a 50 y.o. female who presents to [place] at [facility] today for [name] and [name] trigger finger.  She was last seen by Dr. Georgina Snell on 04/30/20 for R shoulder pain.  Since her last visit, pt reports [name] and R finger pain/triggering. Pt has been exercising regularly at the gym and is competing in a weight loss challenge this week.  [name]: Pt reports pain ongoing for past 1-2 weeks. Pt locates pain to anterior aspect of [name]. -Swelling: no -Mechanical symptoms: no -Aggravating factors: going down stairs,  -Treatments tried: ice  [name]/finger: Pt reports 4th digit is getting stuck into finger flexion. Motion is inhibited at PCP joint primarily. -Swelling: no -Aggravating factors: nothing -Treatments tried: stretching fingers, popping finger back into extension  Pertinent review of systems: No fevers or chills  Relevant historical information: Hypertension, prediabetes.  [name].   Exam:  BP 98/65 (BP Location: Right Arm, Patient Position: Sitting, Cuff Size: Normal)   Pulse 71   Ht 5\' 2"  (1.575 m)   Wt 155 lb 3.2 oz (70.4 kg)   LMP 08/22/2018   SpO2 99%   BMI 28.39 kg/m  General: Well Developed, well nourished, and in no acute distress.   MSK: Right hand normal-appearing Triggering present with flexion of fourth PIP with palpable click or pop present at palmar MCP.  Right knee normal-appearing Normal motion with crepitation. Intact strength. Mildly tender palpation medial joint line and anterior medial knee. Stable ligamentous exam.     Lab and Radiology Results  Procedure: Real-time Ultrasound Guided Injection of right hand fourth digit MCP A1 pulley (trigger finger) Device: Philips Affiniti 50G Images permanently stored and available for review in PACS Verbal informed consent obtained.  Discussed risks and benefits of procedure. Warned about infection  bleeding damage to structures skin hypopigmentation and fat atrophy among others. Patient expresses understanding and agreement Time-out conducted.   Noted no overlying erythema, induration, or other signs of local infection.   Skin prepped in a sterile fashion.   Local anesthesia: Topical Ethyl chloride.   With sterile technique and under real time ultrasound guidance:  40 mg of Depo-Medrol and 0.5 mL of lidocaine injected into tendon sheath at A1 pulley. Fluid seen entering the tendon sheath.   Completed without difficulty   Pain immediately resolved suggesting accurate placement of the medication.   Advised to call if fevers/chills, erythema, induration, drainage, or persistent bleeding.   Images permanently stored and available for review in the ultrasound unit.  Impression: Technically successful ultrasound guided injection.   X-ray images right knee obtained today personally and independently interpreted Mild medial compartment and patellofemoral DJD.  Small osteophyte present superior patellar pole.  No fractures visible. Await formal radiology review      Assessment and Plan: 50 y.o. female with right hand trigger finger.  Plan to treat with injection and double Band-Aid splint as well as Voltaren gel.  Recheck back as needed.  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  Plan for Voltaren gel and mostly physical therapy.  Consider knee injection in the future if needed however that is not the primary treatment methodology for this issue.  Recheck back in about 6 weeks.  Discussed activity modification as well.   PDMP not reviewed this encounter. Orders Placed This Encounter  Procedures  . [place] LIMITED  JOINT SPACE STRUCTURES LOW RIGHT(NO LINKED CHARGES)    Standing Status:   Future    Number of Occurrences:   1    Standing Expiration Date:   02/20/2021    Order Specific Question:   Reason for Exam (SYMPTOM  OR  DIAGNOSIS REQUIRED)    Answer:   Right knee pain    Order Specific Question:   Preferred imaging location?    Answer:   [place]  . DG Knee AP/LAT W/[place] Right    Standing Status:   Future    Number of Occurrences:   1    Standing Expiration Date:   08/21/2021    Order Specific Question:   Reason for Exam (SYMPTOM  OR DIAGNOSIS REQUIRED)    Answer:   Right knee pain    Order Specific Question:   Preferred imaging location?    Answer:   [facility]    Order Specific Question:   Is patient pregnant?    Answer:   No  . Ambulatory referral to Physical Therapy    Referral Priority:   Routine    Referral Type:   Physical Medicine    Referral Reason:   Specialty Services Required    Requested Specialty:   Physical Therapy   No orders of the defined types were placed in this encounter.    Discussed warning signs or symptoms. Please see discharge instructions. Patient expresses understanding.   The above documentation has been reviewed and is accurate and complete Lynne Leader, M.D.

## 2020-08-21 ENCOUNTER — Ambulatory Visit: Payer: BC Managed Care – PPO | Admitting: Family Medicine

## 2020-08-21 ENCOUNTER — Telehealth: Payer: Self-pay

## 2020-08-21 ENCOUNTER — Ambulatory Visit: Payer: BC Managed Care – PPO | Admitting: Internal Medicine

## 2020-08-21 ENCOUNTER — Other Ambulatory Visit: Payer: Self-pay

## 2020-08-21 ENCOUNTER — Ambulatory Visit: Payer: Self-pay

## 2020-08-21 ENCOUNTER — Ambulatory Visit (INDEPENDENT_AMBULATORY_CARE_PROVIDER_SITE_OTHER): Payer: BC Managed Care – PPO

## 2020-08-21 VITALS — BP 98/65 | HR 71 | Ht 62.0 in | Wt 155.2 lb

## 2020-08-21 DIAGNOSIS — G8929 Other chronic pain: Secondary | ICD-10-CM | POA: Diagnosis not present

## 2020-08-21 DIAGNOSIS — M25561 Pain in right knee: Secondary | ICD-10-CM

## 2020-08-21 DIAGNOSIS — M65341 Trigger finger, right ring finger: Secondary | ICD-10-CM | POA: Diagnosis not present

## 2020-08-21 NOTE — Patient Instructions (Addendum)
Thank you for coming in today.   You had a R ring finger injection today.  Call or go to the ER if you develop a large red swollen joint with extreme pain or oozing puss.   Please get an Xray today before you leave  Recheck in about 6 weeks.    Trigger Finger  Trigger finger, also called stenosing tenosynovitis,  is a condition that causes a finger to get stuck in a bent position. Each finger has a tendon, which is a tough, cord-like tissue that connects muscle to bone, and each tendon passes through a tunnel of tissue called a tendon sheath. To move your finger, your tendon needs to glide freely through the sheath. Trigger finger happens when the tendon or the sheath thickens, making it difficult to move your finger. Trigger finger can affect any finger or a thumb. It may affect more than one finger. Mild cases may clear up with rest and medicine. Severe cases require more treatment. What are the causes? Trigger finger is caused by a thickened finger tendon or tendon sheath. The cause of this thickening is not known. What increases the risk? The following factors may make you more likely to develop this condition:  Doing activities that require a strong grip.  Having rheumatoid arthritis, gout, or diabetes.  Being 32-27 years old.  Being female. What are the signs or symptoms? Symptoms of this condition include:  Pain when bending or straightening your finger.  Tenderness or swelling where your finger attaches to the palm of your hand.  A lump in the palm of your hand or on the inside of your finger.  Hearing a noise like a pop or a snap when you try to straighten your finger.  Feeling a catching or locking sensation when you try to straighten your finger.  Being unable to straighten your finger. How is this diagnosed? This condition is diagnosed based on your symptoms and a physical exam. How is this treated? This condition may be treated by:  Resting your finger and  avoiding activities that make symptoms worse.  Wearing a finger splint to keep your finger extended.  Taking NSAIDs, such as ibuprofen, to relieve pain and swelling.  Doing gentle exercises to stretch the finger as told by your health care provider.  Having medicine that reduces swelling and inflammation (steroids) injected into the tendon sheath. Injections may need to be repeated.  Having surgery to open the tendon sheath. This may be done if other treatments do not work and you cannot straighten your finger. You may need physical therapy after surgery. Follow these instructions at home: If you have a splint:  Wear the splint as told by your health care provider. Remove it only as told by your health care provider.  Loosen it if your fingers tingle, become numb, or turn cold and blue.  Keep it clean.  If the splint is not waterproof: ? Do not let it get wet. ? Cover it with a watertight covering when you take a bath or shower. Managing pain, stiffness, and swelling If directed, apply heat to the affected area as often as told by your health care provider. Use the heat source that your health care provider recommends, such as a moist heat pack or a heating pad.  Place a towel between your skin and the heat source.  Leave the heat on for 20-30 minutes.  Remove the heat if your skin turns bright red. This is especially important if you are unable  to feel pain, heat, or cold. You may have a greater risk of getting burned. If directed, put ice on the painful area. To do this:  If you have a removable splint, remove it as told by your health care provider.  Put ice in a plastic bag.  Place a towel between your skin and the bag or between your splint and the bag.  Leave the ice on for 20 minutes, 2-3 times a day.      Activity  Rest your finger as told by your health care provider. Avoid activities that make the pain worse.  Return to your normal activities as told by your  health care provider. Ask your health care provider what activities are safe for you.  Do exercises as told by your health care provider.  Ask your health care provider when it is safe to drive if you have a splint on your hand. General instructions  Take over-the-counter and prescription medicines only as told by your health care provider.  Keep all follow-up visits as told by your health care provider. This is important. Contact a health care provider if:  Your symptoms are not improving with home care. Summary  Trigger finger, also called stenosing tenosynovitis, causes your finger to get stuck in a bent position. This can make it difficult and painful to straighten your finger.  This condition develops when a finger tendon or tendon sheath thickens.  Treatment may include resting your finger, wearing a splint, and taking medicines.  In severe cases, surgery to open the tendon sheath may be needed. This information is not intended to replace advice given to you by your health care provider. Make sure you discuss any questions you have with your health care provider. Document Revised: 09/25/2018 Document Reviewed: 09/25/2018 Elsevier Patient Education  [place].

## 2020-08-21 NOTE — Telephone Encounter (Signed)
Patient called and wanted to know if she should wear a knee brace, if that would help with the pain.

## 2020-08-21 NOTE — Telephone Encounter (Signed)
Called pt and relayed Dr. Clovis Riley advice regarding knee brace which is that he doesn't feel that a brace will be overly helpful but he's not opposed to her trying one.  Advise pt that if she is to get a knee brace, that she should look for one that claims to help support/stabilize her patella.

## 2020-08-22 NOTE — Progress Notes (Signed)
X-ray right knee shows some mild arthritis changes.  No fractures visible.

## 2020-08-26 ENCOUNTER — Encounter: Payer: Self-pay | Admitting: Internal Medicine

## 2020-08-27 ENCOUNTER — Telehealth: Payer: Self-pay

## 2020-08-27 NOTE — Telephone Encounter (Signed)
Prior authorization for [place] [id number]  Submitted waithing on response

## 2020-09-01 ENCOUNTER — Telehealth: Payer: Self-pay

## 2020-09-01 NOTE — Telephone Encounter (Signed)
Called to schedule 77month follow up appt per Dr. Jenny Reichmann left patient a voicemail to call back.

## 2020-09-04 NOTE — Telephone Encounter (Signed)
Prior authorization for [place] [id number]  Approved 08/27/2020 - 08-28-2023

## 2020-09-22 ENCOUNTER — Telehealth: Payer: Self-pay | Admitting: Internal Medicine

## 2020-09-22 ENCOUNTER — Encounter: Payer: Self-pay | Admitting: Internal Medicine

## 2020-09-22 NOTE — Telephone Encounter (Signed)
Patient calling, wondering when her last TB test was.  [phone]

## 2020-09-22 NOTE — Telephone Encounter (Signed)
Patient found records else where.

## 2020-09-23 ENCOUNTER — Encounter: Payer: Self-pay | Admitting: Internal Medicine

## 2020-10-03 ENCOUNTER — Ambulatory Visit: Payer: BC Managed Care – PPO | Admitting: Family Medicine

## 2020-11-03 ENCOUNTER — Encounter: Payer: Self-pay | Admitting: Internal Medicine

## 2020-11-04 MED ORDER — TRIAMCINOLONE ACETONIDE 0.1 % EX OINT: 1.0000 "application " | TOPICAL_OINTMENT | Freq: Two times a day (BID) | CUTANEOUS | 0 refills | Status: DC

## 2020-12-19 ENCOUNTER — Ambulatory Visit: Payer: BC Managed Care – PPO | Attending: Critical Care Medicine

## 2020-12-19 DIAGNOSIS — Z20822 Contact with and (suspected) exposure to covid-19: Secondary | ICD-10-CM

## 2020-12-20 ENCOUNTER — Encounter: Payer: Self-pay | Admitting: Internal Medicine

## 2020-12-20 LAB — NOVEL CORONAVIRUS, NAA: SARS-CoV-2, NAA: NOT DETECTED

## 2020-12-20 LAB — SARS-COV-2, NAA 2 DAY TAT

## 2020-12-22 MED ORDER — AZITHROMYCIN 250 MG PO TABS: ORAL_TABLET | ORAL | 0 refills | Status: AC

## 2020-12-22 NOTE — Telephone Encounter (Signed)
I can see at 1230 for a short VV if pt would want this - thanks

## 2020-12-22 NOTE — Telephone Encounter (Signed)
[place] thing didn't work out, how about 445 pm?

## 2021-03-24 ENCOUNTER — Other Ambulatory Visit: Payer: Self-pay | Admitting: Internal Medicine

## 2021-03-26 MED ORDER — TRIAMCINOLONE ACETONIDE 0.1 % EX OINT: 1.0000 "application " | TOPICAL_OINTMENT | Freq: Two times a day (BID) | CUTANEOUS | 0 refills | Status: AC

## 2021-05-05 NOTE — Progress Notes (Signed)
I, Summer Robinson, LAT, ATC, am serving as scribe for Dr. Lynne Leader.  Summer Robinson is a 50 y.o. female who presents to [place] at [facility] today for f/u of B knee and R wrist/hand pain.  She was last seen by Dr. Georgina Snell on 08/21/20 for her [name] and R 4th finger trigger finger.  She had a R 4th finger trigger finger steroid injection and was referred to PT for her [name] of which she completed 0 visits.  Today, pt reports she is having R 3rd and 4th finger pain w/ difficulty w/ writing and gripping.  She also con't to have [name] pain.  She uses the Voltaren gel intermittently. She was referred to PT and they called her x3 but she did not return call or attend PT.  She con't to have pain w/ lunges and loaded weight-bearing activity.  Patient notes some numbness and tingling left hand radial side ulnar aspect.  This is worse with sleep and with driving.  No injury.  No treatment tried yet.  Diagnostic testing: [name] XR- 08/21/20  Pertinent review of systems: No fevers or chills  Relevant historical information: Patient lives in [place] but works in [place] for [employer].   Exam:  BP 110/84 (BP Location: Right Arm, Patient Position: Sitting, Cuff Size: Normal)   Pulse 67   Ht 5\' 2"  (1.575 m)   Wt 170 lb (77.1 kg)   LMP 08/22/2018   SpO2 97%   BMI 31.09 kg/m  General: Well Developed, well nourished, and in no acute distress.   MSK: Right hand slightly swollen third and fourth PIP.  No triggering present.  Nontender MCPs.  Slight decreased range of motion in flexion and involved PIPs.  Intact strength.  Left hand and wrist normal-appearing Normal strength and sensation.  Positive Tinel's at carpal tunnel.  Positive Phalen's test.  Right knee normal-appearing normal motion with crepitation.    Lab and Radiology Results  X-ray images right hand obtained today personally and independently interpreted Minimal DJD at PIPs.  No  erosions.  No acute fractures. Await formal radiology review     EXAM: RIGHT KNEE 3 VIEWS   COMPARISON:  None.   FINDINGS: No evidence of acute fracture, dislocation, or joint effusion. Chronic changes are seen along the lateral aspect of the right patella. Mild medial and lateral tibiofemoral compartment space narrowing is seen. Soft tissues are unremarkable.   IMPRESSION: Mild chronic and degenerative changes without an acute osseous abnormality.     Electronically Signed   By: Virgina Robinson M.D.   On: 08/21/2020 22:41 I, Lynne Leader, personally (independently) visualized and performed the interpretation of the images attached in this note.   Assessment and Plan: 50 y.o. female with right hand pain predominantly PIPs..  Pain thought to be some combination of DJD and worsened by her frequent activity.  Rheumatologic disease is possible but much less likely.  If not improved with below treatment consider rheumatologic work-up in the future.  Plan for Voltaren gel, home exercise program and referral to physical therapy.   Left hand paresthesias thought to be carpal tunnel syndrome.  Plan for cock up wrist splint.  Right knee pain.  Patient has anterior knee pain with lunge activity.  Pain thought to be due to chondromalacia patella/patellofemoral pain syndrome.  He is an excellent candidate for physical therapy.  Plan for physical therapy.  She works in [employer] so we will [employer]  therapy location near to where she works.  Based on reviewing a map we will use [facility] physical therapy at [facility].  Check back with me in 6 weeks.   PDMP not reviewed this encounter. Orders Placed This Encounter  Procedures   DG Hand Complete Right    Standing Status:   Future    Number of Occurrences:   1    Standing Expiration Date:   06/06/2021    Order Specific Question:   Reason for Exam (SYMPTOM  OR DIAGNOSIS REQUIRED)    Answer:   [name] pain    Order Specific Question:   Is  patient pregnant?    Answer:   No    Order Specific Question:   Preferred imaging location?    Answer:   [facility]   Ambulatory referral to Physical Therapy    Referral Priority:   Routine    Referral Type:   Physical Medicine    Referral Reason:   Specialty Services Required    Requested Specialty:   Physical Therapy    Number of Visits Requested:   1   No orders of the defined types were placed in this encounter.    Discussed warning signs or symptoms. Please see discharge instructions. Patient expresses understanding.   The above documentation has been reviewed and is accurate and complete Lynne Leader, M.D.

## 2021-05-06 ENCOUNTER — Ambulatory Visit: Payer: BC Managed Care – PPO | Admitting: Family Medicine

## 2021-05-06 ENCOUNTER — Other Ambulatory Visit: Payer: Self-pay

## 2021-05-06 ENCOUNTER — Ambulatory Visit (INDEPENDENT_AMBULATORY_CARE_PROVIDER_SITE_OTHER): Payer: BC Managed Care – PPO

## 2021-05-06 ENCOUNTER — Encounter: Payer: Self-pay | Admitting: Family Medicine

## 2021-05-06 VITALS — BP 110/84 | HR 67 | Ht 62.0 in | Wt 170.0 lb

## 2021-05-06 DIAGNOSIS — G5602 Carpal tunnel syndrome, left upper limb: Secondary | ICD-10-CM

## 2021-05-06 DIAGNOSIS — M79641 Pain in right hand: Secondary | ICD-10-CM

## 2021-05-06 DIAGNOSIS — M25561 Pain in right knee: Secondary | ICD-10-CM

## 2021-05-06 NOTE — Patient Instructions (Addendum)
Good to see you today.  I've re-referred you to Physical Therapy.  Phone: [phone]  Look into the carpal tunnel wrist brace we discussed.  Please use Voltaren gel (Generic Diclofenac Gel) up to 4x daily for pain as needed.  This is available over-the-counter as both the name brand Voltaren gel and the generic diclofenac gel.   Please get an Xray today before you leave.  Follow-up: 6 weeks

## 2021-05-07 NOTE — Progress Notes (Signed)
Right hand x-ray shows some minimal arthritis

## 2021-05-24 ENCOUNTER — Encounter: Payer: Self-pay | Admitting: Internal Medicine

## 2021-05-26 MED ORDER — SCOPOLAMINE 1 MG/3DAYS TD PT72: 1.0000 | MEDICATED_PATCH | TRANSDERMAL | 0 refills | Status: DC

## 2021-05-27 NOTE — Telephone Encounter (Signed)
Contacted patient to clarify message about refills.

## 2021-06-16 NOTE — Progress Notes (Deleted)
° °  I, Wendy Poet, LAT, ATC, am serving as scribe for Dr. Lynne Leader.  Summer Robinson is a 51 y.o. female who presents to [place] at [facility] today for f/u of B knee and R wrist/hand pain.  She was last seen by Dr. Georgina Snell on 05/06/21 w/ R 3rd and 4th finger pain and [name] pain.  She was re-referred to PT to a different location closer to her work.  She was advised to con't using Voltaren gel and was to look into a carpal tunnel brace for her L wrist/hand due to paresthesias in her L hand.  Today, pt reports   Diagnostic testing: [name] XR- 08/21/20; [name] XR- 05/06/21  Pertinent review of systems: ***  Relevant historical information: ***   Exam:  LMP 08/22/2018  General: Well Developed, well nourished, and in no acute distress.   MSK: ***    Lab and Radiology Results No results found for this or any previous visit (from the past 72 hour(s)). No results found.     Assessment and Plan: 51 y.o. female with ***   PDMP not reviewed this encounter. No orders of the defined types were placed in this encounter.  No orders of the defined types were placed in this encounter.    Discussed warning signs or symptoms. Please see discharge instructions. Patient expresses understanding.   ***

## 2021-06-17 ENCOUNTER — Ambulatory Visit: Payer: BC Managed Care – PPO | Admitting: Family Medicine

## 2021-08-19 ENCOUNTER — Other Ambulatory Visit: Payer: Self-pay

## 2021-08-19 ENCOUNTER — Telehealth: Payer: Self-pay | Admitting: Family Medicine

## 2021-08-19 ENCOUNTER — Other Ambulatory Visit: Payer: Self-pay | Admitting: Physical Therapy

## 2021-08-19 DIAGNOSIS — M79641 Pain in right hand: Secondary | ICD-10-CM

## 2021-08-19 DIAGNOSIS — M25561 Pain in right knee: Secondary | ICD-10-CM

## 2021-08-19 DIAGNOSIS — G8929 Other chronic pain: Secondary | ICD-10-CM

## 2021-08-19 NOTE — Telephone Encounter (Signed)
Patient called stating that she was not able to start therapy when the referral was previously sent.  ?She asked if another referral could be sent to the [place] at [facility]? ? ?Please advise. ? ?

## 2021-08-19 NOTE — Telephone Encounter (Signed)
Patient called back stating that she spoke to the [facility] PT and they bill as a hospital so it would be very expensive. ?She asked if the referral could be changed to [facility] instead. ? ? ?

## 2021-08-19 NOTE — Telephone Encounter (Signed)
New referral placed for both [name] and [name] to [facility] at [facility]. ?

## 2021-08-19 NOTE — Telephone Encounter (Signed)
Will fax the [facility] to [facility] tomorrow  ?

## 2021-08-19 NOTE — Telephone Encounter (Signed)
A new PT referral was placed to [facility]  ?

## 2021-08-23 ENCOUNTER — Encounter: Payer: Self-pay | Admitting: Internal Medicine

## 2021-09-07 ENCOUNTER — Encounter: Payer: BC Managed Care – PPO | Admitting: Internal Medicine

## 2021-09-07 NOTE — Telephone Encounter (Signed)
Pt r/s appt due to conflict in schedule ? ?Pt inquiring if a short supply can be prescribed until next ov on 09-29-2021 ? ?

## 2021-09-08 MED ORDER — OMEPRAZOLE 40 MG PO CPDR: 40.0000 mg | DELAYED_RELEASE_CAPSULE | Freq: Every day | ORAL | 0 refills | Status: DC

## 2021-09-08 NOTE — Addendum Note (Signed)
Addended by: Earnstine Regal on: 09/08/2021 12:01 PM ? ? Modules accepted: Orders ? ?

## 2021-09-29 ENCOUNTER — Encounter: Payer: Self-pay | Admitting: Internal Medicine

## 2021-09-29 ENCOUNTER — Ambulatory Visit (INDEPENDENT_AMBULATORY_CARE_PROVIDER_SITE_OTHER): Payer: BC Managed Care – PPO | Admitting: Internal Medicine

## 2021-09-29 VITALS — BP 136/80 | HR 70 | Temp 98.6°F | Ht 62.0 in | Wt 161.0 lb

## 2021-09-29 DIAGNOSIS — E559 Vitamin D deficiency, unspecified: Secondary | ICD-10-CM | POA: Diagnosis not present

## 2021-09-29 DIAGNOSIS — Z0001 Encounter for general adult medical examination with abnormal findings: Secondary | ICD-10-CM

## 2021-09-29 DIAGNOSIS — R7303 Prediabetes: Secondary | ICD-10-CM

## 2021-09-29 DIAGNOSIS — N951 Menopausal and female climacteric states: Secondary | ICD-10-CM

## 2021-09-29 DIAGNOSIS — I10 Essential (primary) hypertension: Secondary | ICD-10-CM

## 2021-09-29 DIAGNOSIS — G8929 Other chronic pain: Secondary | ICD-10-CM

## 2021-09-29 DIAGNOSIS — Z20828 Contact with and (suspected) exposure to other viral communicable diseases: Secondary | ICD-10-CM | POA: Diagnosis not present

## 2021-09-29 DIAGNOSIS — E538 Deficiency of other specified B group vitamins: Secondary | ICD-10-CM

## 2021-09-29 DIAGNOSIS — M25561 Pain in right knee: Secondary | ICD-10-CM

## 2021-09-29 DIAGNOSIS — K5900 Constipation, unspecified: Secondary | ICD-10-CM

## 2021-09-29 LAB — URINALYSIS, ROUTINE W REFLEX MICROSCOPIC
Bilirubin Urine: NEGATIVE
Hgb urine dipstick: NEGATIVE
Ketones, ur: NEGATIVE
Leukocytes,Ua: NEGATIVE
Nitrite: NEGATIVE
RBC / HPF: NONE SEEN (ref 0–?)
Specific Gravity, Urine: 1.025 (ref 1.000–1.030)
Total Protein, Urine: NEGATIVE
Urine Glucose: NEGATIVE
Urobilinogen, UA: 0.2 (ref 0.0–1.0)
pH: 6 (ref 5.0–8.0)

## 2021-09-29 LAB — BASIC METABOLIC PANEL
BUN: 10 mg/dL (ref 6–23)
CO2: 28 mEq/L (ref 19–32)
Calcium: 9.6 mg/dL (ref 8.4–10.5)
Chloride: 103 mEq/L (ref 96–112)
Creatinine, Ser: 0.86 mg/dL (ref 0.40–1.20)
GFR: 78.56 mL/min (ref >60.00)
Glucose, Bld: 80 mg/dL (ref 70–99)
Potassium: 3.4 mEq/L — ABNORMAL LOW (ref 3.5–5.1)
Sodium: 138 mEq/L (ref 135–145)

## 2021-09-29 LAB — LIPID PANEL
Cholesterol: 179 mg/dL (ref 0–200)
HDL: 67.6 mg/dL (ref >39.00)
LDL Cholesterol: 97 mg/dL (ref 0–99)
NonHDL: 111.85
Total CHOL/HDL Ratio: 3
Triglycerides: 76 mg/dL (ref 0.0–149.0)
VLDL: 15.2 mg/dL (ref 0.0–40.0)

## 2021-09-29 LAB — CBC WITH DIFFERENTIAL/PLATELET
Basophils Absolute: 0 10*3/uL (ref 0.0–0.1)
Basophils Relative: 0.8 % (ref 0.0–3.0)
Eosinophils Absolute: 0 10*3/uL (ref 0.0–0.7)
Eosinophils Relative: 1 % (ref 0.0–5.0)
HCT: 38.6 % (ref 36.0–46.0)
Hemoglobin: 12.7 g/dL (ref 12.0–15.0)
Lymphocytes Relative: 45.6 % (ref 12.0–46.0)
Lymphs Abs: 2 10*3/uL (ref 0.7–4.0)
MCHC: 32.9 g/dL (ref 30.0–36.0)
MCV: 89.1 fl (ref 78.0–100.0)
Monocytes Absolute: 0.4 10*3/uL (ref 0.1–1.0)
Monocytes Relative: 8.4 % (ref 3.0–12.0)
Neutro Abs: 1.9 10*3/uL (ref 1.4–7.7)
Neutrophils Relative %: 44.2 % (ref 43.0–77.0)
Platelets: 194 10*3/uL (ref 150.0–400.0)
RBC: 4.34 Mil/uL (ref 3.87–5.11)
RDW: 15.1 % (ref 11.5–15.5)
WBC: 4.3 10*3/uL (ref 4.0–10.5)

## 2021-09-29 LAB — VITAMIN B12: Vitamin B-12: 382 pg/mL (ref 211–911)

## 2021-09-29 LAB — HEPATIC FUNCTION PANEL
ALT: 19 U/L (ref 0–35)
AST: 26 U/L (ref 0–37)
Albumin: 4.1 g/dL (ref 3.5–5.2)
Alkaline Phosphatase: 72 U/L (ref 39–117)
Bilirubin, Direct: 0.1 mg/dL (ref 0.0–0.3)
Total Bilirubin: 0.6 mg/dL (ref 0.2–1.2)
Total Protein: 8.1 g/dL (ref 6.0–8.3)

## 2021-09-29 LAB — HEMOGLOBIN A1C: Hgb A1c MFr Bld: 5.7 % (ref 4.6–6.5)

## 2021-09-29 LAB — TSH: TSH: 1.49 u[IU]/mL (ref 0.35–5.50)

## 2021-09-29 LAB — VITAMIN D 25 HYDROXY (VIT D DEFICIENCY, FRACTURES): VITD: 27.44 ng/mL — ABNORMAL LOW (ref 30.00–100.00)

## 2021-09-29 MED ORDER — SERTRALINE HCL 50 MG PO TABS: 50.0000 mg | ORAL_TABLET | Freq: Every day | ORAL | 3 refills | Status: DC

## 2021-09-29 MED ORDER — OMEPRAZOLE 40 MG PO CPDR: 40.0000 mg | DELAYED_RELEASE_CAPSULE | Freq: Every day | ORAL | 3 refills | Status: DC

## 2021-09-29 NOTE — Patient Instructions (Addendum)
Please check with the insurance about the Shingles shot coverage ? ?Please take all new medication as prescribed - the zoloft low dose ? ?Please take OTC Vitamin D3 at 2000 units per day, indefinitely ? ?Please make appt at the first floor Sports Medicine for the Right Knee ? ?Please continue all other medications as before, and refills have been done if requested - the prilosec ? ?Please have the pharmacy call with any other refills you may need. ? ?Please continue your efforts at being more active, low cholesterol diet, and weight control. ? ?You are otherwise up to date with prevention measures today. ? ?Please keep your appointments with your specialists as you may have planned ? ?Please go to the LAB at the blood drawing area for the tests to be done ? ?You will be contacted by phone if any changes need to be made immediately.  Otherwise, you will receive a letter about your results with an explanation, but please check with MyChart first. ? ?Please remember to sign up for MyChart if you have not done so, as this will be important to you in the future with finding out test results, communicating by private email, and scheduling acute appointments online when needed. ? ?Please make an Appointment to return for your 1 year visit, or sooner if needed ? ?

## 2021-09-29 NOTE — Progress Notes (Signed)
Patient ID: Summer Robinson, female   DOB: 27-Dec-1970, 51 y.o.   MRN: [id number] ? ? ? ?     Chief Complaint:: wellness exam and ? Need for shignrix, hot flashes, low vit D, right knee pain, and constipatoin ? ?     HPI:  Summer Robinson is a 51 y.o. female here for wellness exam; has pap and mammogram scheduled soon; declines shingrix as she is not sure she has had the chickenpox in the past; o/w up to date ?         ?              Also not taking Vit d.  Has worsening recent onset hot flashes related to menopause, also constipation worsening for some reason, maybe being less active recently.  Also 1 mo onset right knee pain and crepitus with little swelling mild intermittent sharp, worse to walk, better to sit.   ?  ?Wt Readings from Last 3 Encounters:  ?09/29/21 161 lb (73 kg)  ?05/06/21 170 lb (77.1 kg)  ?08/21/20 155 lb 3.2 oz (70.4 kg)  ? ?BP Readings from Last 3 Encounters:  ?09/29/21 136/80  ?05/06/21 110/84  ?08/21/20 98/65  ? ?Immunization History  ?Administered Date(s) Administered  ? Influenza-Unspecified 03/07/2018  ? PFIZER(Purple Top)SARS-COV-2 Vaccination 07/19/2019, 08/14/2019, 03/05/2020  ? Tdap 03/01/2018  ? ?There are no preventive care reminders to display for this patient. ? ?  ? ?Past Medical History:  ?Diagnosis Date  ? GERD (gastroesophageal reflux disease)   ? Hay fever   ? ?History reviewed. No pertinent surgical history. ? reports that she has never smoked. She has never used smokeless tobacco. She reports current alcohol use. She reports that she does not use drugs. ?family history includes Cancer in her father; Diabetes in her mother; High Cholesterol in her mother; High blood pressure in her mother; Miscarriages / Stillbirths in her mother. ?No Known Allergies ?Current Outpatient Medications on File Prior to Visit  ?Medication Sig Dispense Refill  ? ALPRAZolam (XANAX) 0.25 MG tablet Take 0.25 mg by mouth at bedtime as needed for anxiety.    ? ibuprofen (ADVIL) 800 MG tablet Take 1  tablet (800 mg total) by mouth every 8 (eight) hours as needed. 30 tablet 3  ? lisdexamfetamine (VYVANSE) 30 MG capsule Take 1 capsule (30 mg total) by mouth daily. 30 capsule 0  ? scopolamine (TRANSDERM SCOP, 1.5 MG,) 1 MG/3DAYS Place 1 patch (1.5 mg total) onto the skin every 3 (three) days. 10 patch 0  ? triamcinolone (NASACORT) 55 MCG/ACT AERO nasal inhaler Place 2 sprays into the nose daily. 1 each 12  ? triamcinolone ointment (KENALOG) 0.1 % Apply 1 application topically 2 (two) times daily. 453.6 g 0  ? Vitamin D, Ergocalciferol, (DRISDOL) 1.25 MG (50000 UT) CAPS capsule Take 1 capsule (50,000 Units total) by mouth every 7 (seven) days. 12 capsule 0  ? cetirizine (ZYRTEC) 10 MG tablet Take 1 tablet (10 mg total) by mouth daily. 30 tablet 11  ? WEGOVY 1 MG/0.5ML SOAJ Inject 1 mg into the skin once a week.    ? ?No current facility-administered medications on file prior to visit.  ? ?     ROS:  All others reviewed and negative. ? ?Objective  ? ?     PE:  BP 136/80 (BP Location: Right Arm, Patient Position: Sitting, Cuff Size: Large)   Pulse 70   Temp 98.6 ?F (37 ?C) (Oral)   Ht '5\' 2"'$  (1.575 m)   Wt  161 lb (73 kg)   LMP 08/22/2018   SpO2 99%   BMI 29.45 kg/m?  ? ?              Constitutional: Pt appears in NAD ?              HENT: Head: NCAT.  ?              Right Ear: External ear normal.   ?              Left Ear: External ear normal.  ?              Eyes: . Pupils are equal, round, and reactive to light. Conjunctivae and EOM are normal ?              Nose: without d/c or deformity ?              Neck: Neck supple. Gross normal ROM ?              Cardiovascular: Normal rate and regular rhythm.   ?              Pulmonary/Chest: Effort normal and breath sounds without rales or wheezing.  ?              Abd:  Soft, NT, ND, + BS, no organomegaly ?              Neurological: Pt is alert. At baseline orientation, motor grossly intact ?              Skin: Skin is warm. No rashes, no other new lesions, LE edema  - none ?              Psychiatric: Pt behavior is normal without agitation  ? ?Micro: none ? ?Cardiac tracings I have personally interpreted today:  none ? ?Pertinent Radiological findings (summarize): none  ? ?Lab Results  ?Component Value Date  ? WBC 4.3 09/29/2021  ? HGB 12.7 09/29/2021  ? HCT 38.6 09/29/2021  ? PLT 194.0 09/29/2021  ? GLUCOSE 80 09/29/2021  ? CHOL 179 09/29/2021  ? TRIG 76.0 09/29/2021  ? HDL 67.60 09/29/2021  ? [place] 97 09/29/2021  ? ALT 19 09/29/2021  ? AST 26 09/29/2021  ? NA 138 09/29/2021  ? K 3.4 (L) 09/29/2021  ? CL 103 09/29/2021  ? CREATININE 0.86 09/29/2021  ? BUN 10 09/29/2021  ? CO2 28 09/29/2021  ? TSH 1.49 09/29/2021  ? HGBA1C 5.7 09/29/2021  ? ?Assessment/Plan:  ?Summer Robinson is a 51 y.o. Black or African American [2] female with  has a past medical history of GERD (gastroesophageal reflux disease) and Hay fever. ? ?Encounter for well adult exam with abnormal findings ?Age and sex appropriate education and counseling updated with regular exercise and diet ?Referrals for preventative services - plans to f/u pap and mammogram soon ?Immunizations addressed - declines shingrix but wants to check varicella IgG first ?Smoking counseling  - none needed ?Evidence for depression or other mood disorder - none significant ?Most recent labs reviewed. ?I have personally reviewed and have noted: ?1) the patient's medical and social history ?2) The patient's current medications and supplements ?3) The patient's height, weight, and BMI have been recorded in the chart ? ? ?Pre-diabetes ?Lab Results  ?Component Value Date  ? HGBA1C 5.7 09/29/2021  ? ?Stable, pt to continue current medical treatment  - diet ? ? ?Vitamin D deficiency ?Last vitamin D ?Lab Results  ?  Component Value Date  ? VD25OH 27.44 (L) 09/29/2021  ? ?Low, to start oral replacement 2000 ? ? ?HTN (hypertension) ?BP Readings from Last 3 Encounters:  ?09/29/21 136/80  ?05/06/21 110/84  ?08/21/20 98/65  ? ?Stable, pt to continue  medical treatment  - diet, wt control ? ? ?Right knee pain ?C/w right knee djd - for volt gel prn, f/u sports medicine ? ?Hot flashes due to menopause ?Pt to start zoloft 50 qd,  to f/u any worsening symptoms or concerns ? ?Constipation ?Mild worsening recent, for miralax qd prn ? ?Followup: Return in about 1 year (around 09/30/2022). ? ?Cathlean Cower, MD 10/03/2021 9:08 PM ?[place] ?[place] ?Internal Medicine ?

## 2021-09-30 ENCOUNTER — Encounter: Payer: Self-pay | Admitting: Internal Medicine

## 2021-09-30 LAB — VARICELLA ZOSTER ANTIBODY, IGG: Varicella IgG: >4000 index

## 2021-10-03 ENCOUNTER — Encounter: Payer: Self-pay | Admitting: Internal Medicine

## 2021-10-03 DIAGNOSIS — K5900 Constipation, unspecified: Secondary | ICD-10-CM | POA: Insufficient documentation

## 2021-10-03 NOTE — Assessment & Plan Note (Signed)
Pt to start zoloft 50 qd,  to f/u any worsening symptoms or concerns ?

## 2021-10-03 NOTE — Assessment & Plan Note (Signed)
Mild worsening recent, for miralax qd prn ?

## 2021-10-03 NOTE — Assessment & Plan Note (Signed)
Last vitamin D ?Lab Results  ?Component Value Date  ? VD25OH 27.44 (L) 09/29/2021  ? ?Low, to start oral replacement 2000 ? ?

## 2021-10-03 NOTE — Assessment & Plan Note (Signed)
C/w right knee djd - for volt gel prn, f/u sports medicine ?

## 2021-10-03 NOTE — Assessment & Plan Note (Signed)
Lab Results  ?Component Value Date  ? HGBA1C 5.7 09/29/2021  ? ?Stable, pt to continue current medical treatment  - diet ? ?

## 2021-10-03 NOTE — Assessment & Plan Note (Signed)
Age and sex appropriate education and counseling updated with regular exercise and diet ?Referrals for preventative services - plans to f/u pap and mammogram soon ?Immunizations addressed - declines shingrix but wants to check varicella IgG first ?Smoking counseling  - none needed ?Evidence for depression or other mood disorder - none significant ?Most recent labs reviewed. ?I have personally reviewed and have noted: ?1) the patient's medical and social history ?2) The patient's current medications and supplements ?3) The patient's height, weight, and BMI have been recorded in the chart ? ?

## 2021-10-03 NOTE — Assessment & Plan Note (Signed)
BP Readings from Last 3 Encounters:  ?09/29/21 136/80  ?05/06/21 110/84  ?08/21/20 98/65  ? ?Stable, pt to continue medical treatment  - diet, wt control ? ?

## 2022-06-05 ENCOUNTER — Other Ambulatory Visit: Payer: Self-pay | Admitting: Internal Medicine

## 2022-06-07 ENCOUNTER — Other Ambulatory Visit: Payer: Self-pay

## 2022-06-07 MED ORDER — SCOPOLAMINE 1 MG/3DAYS TD PT72: 1.0000 | MEDICATED_PATCH | TRANSDERMAL | 0 refills | Status: DC

## 2022-09-27 ENCOUNTER — Telehealth: Payer: BC Managed Care – PPO | Admitting: Internal Medicine

## 2022-09-27 DIAGNOSIS — E538 Deficiency of other specified B group vitamins: Secondary | ICD-10-CM

## 2022-09-27 DIAGNOSIS — K5900 Constipation, unspecified: Secondary | ICD-10-CM

## 2022-09-27 DIAGNOSIS — Z0001 Encounter for general adult medical examination with abnormal findings: Secondary | ICD-10-CM

## 2022-09-27 DIAGNOSIS — E559 Vitamin D deficiency, unspecified: Secondary | ICD-10-CM | POA: Diagnosis not present

## 2022-09-27 DIAGNOSIS — R7303 Prediabetes: Secondary | ICD-10-CM

## 2022-09-27 DIAGNOSIS — G8929 Other chronic pain: Secondary | ICD-10-CM

## 2022-09-27 DIAGNOSIS — M25561 Pain in right knee: Secondary | ICD-10-CM

## 2022-09-27 DIAGNOSIS — J309 Allergic rhinitis, unspecified: Secondary | ICD-10-CM | POA: Diagnosis not present

## 2022-09-27 DIAGNOSIS — I10 Essential (primary) hypertension: Secondary | ICD-10-CM

## 2022-09-27 DIAGNOSIS — F988 Other specified behavioral and emotional disorders with onset usually occurring in childhood and adolescence: Secondary | ICD-10-CM

## 2022-09-27 MED ORDER — IBUPROFEN 800 MG PO TABS: 800.0000 mg | ORAL_TABLET | Freq: Three times a day (TID) | ORAL | 3 refills | Status: DC | PRN

## 2022-09-27 MED ORDER — AZSTARYS 26.1-5.2 MG PO CAPS: ORAL_CAPSULE | ORAL | 0 refills | Status: DC

## 2022-09-27 MED ORDER — SCOPOLAMINE 1 MG/3DAYS TD PT72: 1.0000 | MEDICATED_PATCH | TRANSDERMAL | 2 refills | Status: AC

## 2022-09-27 NOTE — Progress Notes (Addendum)
Patient ID: Summer Robinson, female   DOB: 09/21/1970, 52 y.o.   MRN: [id number]  Virtual Visit via Video Note  I connected with Summer Robinson on Sep 27 2022 at 10:00 AM EDT by a video enabled telemedicine application and verified that I am speaking with the correct person using two identifiers.  Location of all participants: Patient: at home Provider: at office   I discussed the limitations of evaluation and management by telemedicine and the availability of in person appointments. The patient expressed understanding and agreed to proceed.         Chief Complaint:: wellness exam and hyperglycemia, right knee pain, allergy, anxiety with hot flushes, , obesity, constipation, bad breath       HPI:  Summer Robinson is a 52 y.o. female here for wellness exam; pt will call for mammogram herself, decliens covid booster, for shingrix at pharmacy, o/w up to date                        Also Pt denies chest pain, increased sob or doe, wheezing, orthopnea, PND, increased LE swelling, palpitations, dizziness or syncope.   Pt denies polydipsia, polyuria, or new focal neuro s/s.    Pt denies fever, wt loss, night sweats, loss of appetite, or other constitutional symptoms  Has mild to mod recent worsening right knee pain sometimes makes her limp to walk, but no giveaways or falls.  Does have several wks ongoing nasal allergy symptoms with clearish congestion, itch and sneezing, without fever, pain, ST, cough, swelling or wheezing, but does have ongoing bad breath as well.  Denies worsening reflux, abd pain, dysphagia, n/v, or blood but has recent worsening constipation that she thought initially was due to wegovy use, but still has the problem after not being able to get wegovy at the pharmacy recently.  Also now on Azst[facility] for ADD per ADD clinic.  She thinks has regained several lbs she hopes to reduce again when wegovy available.  Denies worsening depressive symptoms, suicidal ideation, or panic; has  ongoing anxiety, with occasional hot flushing as well.  .   Wt Readings from Last 3 Encounters:  09/29/21 161 lb (73 kg)  05/06/21 170 lb (77.1 kg)  08/21/20 155 lb 3.2 oz (70.4 kg)   BP Readings from Last 3 Encounters:  09/29/21 136/80  05/06/21 110/84  08/21/20 98/65   Immunization History  Administered Date(s) Administered   Influenza-Unspecified 03/07/2018   PFIZER(Purple Top)SARS-COV-2 Vaccination 07/19/2019, 08/14/2019, 03/05/2020   Tdap 03/01/2018  There are no preventive care reminders to display for this patient.    Past Medical History:  Diagnosis Date   GERD (gastroesophageal reflux disease)    Hay fever    History reviewed. No pertinent surgical history.  reports that she has never smoked. She has never used smokeless tobacco. She reports current alcohol use. She reports that she does not use drugs. family history includes Cancer in her father; Diabetes in her mother; High Cholesterol in her mother; High blood pressure in her mother; Miscarriages / Stillbirths in her mother. No Known Allergies Current Outpatient Medications on File Prior to Visit  Medication Sig Dispense Refill   ALPRAZolam (XANAX) 0.25 MG tablet Take 0.25 mg by mouth at bedtime as needed for anxiety.     cetirizine (ZYRTEC) 10 MG tablet Take 1 tablet (10 mg total) by mouth daily. 30 tablet 11   omeprazole (PRILOSEC) 40 MG capsule Take 1 capsule (40 mg total) by mouth daily. Please keep  May 9th appt for future refills 90 capsule 3   sertraline (ZOLOFT) 50 MG tablet Take 1 tablet (50 mg total) by mouth daily. 90 tablet 3   triamcinolone (NASACORT) 55 MCG/ACT AERO nasal inhaler Place 2 sprays into the nose daily. 1 each 12   triamcinolone ointment (KENALOG) 0.1 % Apply 1 application topically 2 (two) times daily. 453.6 g 0   No current facility-administered medications on file prior to visit.        ROS:  All others reviewed and negative.  Objective        PE:  LMP 08/22/2018                No  VS for this visit               Alert, NAD, appropriate mood and affect, resps normal, cn 2-12 intact, moves all 4s, no visible rash or swelling  Micro: none  Cardiac tracings I have personally interpreted today:  none  Pertinent Radiological findings (summarize): none   Lab Results  Component Value Date   WBC 4.3 09/29/2021   HGB 12.7 09/29/2021   HCT 38.6 09/29/2021   PLT 194.0 09/29/2021   GLUCOSE 80 09/29/2021   CHOL 179 09/29/2021   TRIG 76.0 09/29/2021   HDL 67.60 09/29/2021   LDLCALC 97 09/29/2021   ALT 19 09/29/2021   AST 26 09/29/2021   NA 138 09/29/2021   K 3.4 (L) 09/29/2021   CL 103 09/29/2021   CREATININE 0.86 09/29/2021   BUN 10 09/29/2021   CO2 28 09/29/2021   TSH 1.49 09/29/2021   HGBA1C 5.7 09/29/2021   Assessment/Plan:  Summer Robinson is a 52 y.o. Black or African American [2] female with  has a past medical history of GERD (gastroesophageal reflux disease) and Hay fever.  Encounter for well adult exam with abnormal findings Age and sex appropriate education and counseling updated with regular exercise and diet Referrals for preventative services - pt will call for mammogram Immunizations addressed - declines covid booster, but for shingrix at pharmacy Smoking counseling  - none needed Evidence for depression or other mood disorder - mild worsening anxiety but declines increased zoloft or counseling for now Most recent labs reviewed. I have personally reviewed and have noted: 1) the patient's medical and social history 2) The patient's current medications and supplements 3) The patient's height, weight, and BMI have been recorded in the chart   Allergic rhinitis Mild to mod with bad breath, for start otc zyrtec and nasacort asd, to f/u any worsening symptoms or concerns, for Summer Robinson and Summer Robinson with labs   ADD (attention deficit disorder) Stable, to continue ADD med as per ADD clinic  HTN (hypertension) BP Readings from Last 3  Encounters:  09/29/21 136/80  05/06/21 110/84  08/21/20 98/65   Stable, pt to continue medical treatment  - diet, wt control   Pre-diabetes Lab Results  Component Value Date   HGBA1C 5.7 09/29/2021   Stable, pt to continue current medical treatment  - diet, wt control    Right knee pain Mild to mod, for referral to sport medicine,  to f/u any worsening symptoms or concerns   Vitamin D deficiency Last vitamin D Lab Results  Component Value Date   VD25OH 27.44 (L) 09/29/2021   Low, to start oral replacement, f/u lab   Constipation Mild to mod recent worsening, for trulance samples to leave at front desk,  to f/u any worsening symptoms or concerns  Followup: Return in about 6 months (around 03/30/2023).  Oliver Barre, MD 09/28/2022 7:41 PM [facility]  Primary Care - [facility] Internal Medicine   I discussed the assessment and treatment plan with the patient. The patient was provided an opportunity to ask questions and all were answered. The patient agreed with the plan and demonstrated an understanding of the instructions.   The patient was advised to call back or seek an in-person evaluation if the symptoms worsen or if the condition fails to improve as anticipated.  Oliver Barre, MD

## 2022-09-27 NOTE — Patient Instructions (Signed)
Please take all new medication as prescribed  Please continue all other medications as before, and refills have been done if requested.  Please have the pharmacy call with any other refills you may need.  Please continue your efforts at being more active, low cholesterol diet, and weight control.  You are otherwise up to date with prevention measures today.  Please keep your appointments with your specialists as you may have planned  You will be contacted regarding the referral for: Sports medicine for the right knee  Please go to the LAB at the blood drawing area for the tests to be done  You will be contacted by phone if any changes need to be made immediately.  Otherwise, you will receive a letter about your results with an explanation, but please check with MyChart first.  Please remember to sign up for MyChart if you have not done so, as this will be important to you in the future with finding out test results, communicating by private email, and scheduling acute appointments online when needed.  Please make an Appointment to return in 6 months, or sooner if needed

## 2022-09-28 ENCOUNTER — Encounter: Payer: Self-pay | Admitting: Internal Medicine

## 2022-09-28 NOTE — Assessment & Plan Note (Signed)
Age and sex appropriate education and counseling updated with regular exercise and diet Referrals for preventative services - pt will call for mammogram Immunizations addressed - declines covid booster, but for shingrix at pharmacy Smoking counseling  - none needed Evidence for depression or other mood disorder - mild worsening anxiety but declines increased zoloft or counseling for now Most recent labs reviewed. I have personally reviewed and have noted: 1) the patient's medical and social history 2) The patient's current medications and supplements 3) The patient's height, weight, and BMI have been recorded in the chart

## 2022-09-28 NOTE — Assessment & Plan Note (Signed)
Stable, to continue ADD med as per ADD clinic

## 2022-09-28 NOTE — Assessment & Plan Note (Signed)
Lab Results  Component Value Date   HGBA1C 5.7 09/29/2021   Stable, pt to continue current medical treatment  - diet, wt control

## 2022-09-28 NOTE — Assessment & Plan Note (Signed)
Last vitamin D Lab Results  Component Value Date   VD25OH 27.44 (L) 09/29/2021   Low, to start oral replacement, f/u lab

## 2022-09-28 NOTE — Assessment & Plan Note (Signed)
Mild to mod, for referral to sport medicine,  to f/u any worsening symptoms or concerns

## 2022-09-28 NOTE — Assessment & Plan Note (Addendum)
Mild to mod with bad breath, for start otc zyrtec and nasacort asd, to f/u any worsening symptoms or concerns, for [place] allergy panel and Food panel with labs

## 2022-09-28 NOTE — Assessment & Plan Note (Signed)
BP Readings from Last 3 Encounters:  ?09/29/21 136/80  ?05/06/21 110/84  ?08/21/20 98/65  ? ?Stable, pt to continue medical treatment  - diet, wt control ? ?

## 2022-09-28 NOTE — Assessment & Plan Note (Signed)
Mild to mod recent worsening, for trulance samples to leave at front desk,  to f/u any worsening symptoms or concerns

## 2022-09-30 ENCOUNTER — Telehealth: Payer: BC Managed Care – PPO | Admitting: Internal Medicine

## 2022-09-30 ENCOUNTER — Ambulatory Visit (INDEPENDENT_AMBULATORY_CARE_PROVIDER_SITE_OTHER): Payer: BC Managed Care – PPO

## 2022-09-30 ENCOUNTER — Other Ambulatory Visit: Payer: Self-pay

## 2022-09-30 ENCOUNTER — Ambulatory Visit: Payer: BC Managed Care – PPO | Admitting: Family Medicine

## 2022-09-30 ENCOUNTER — Encounter: Payer: Self-pay | Admitting: Family Medicine

## 2022-09-30 VITALS — BP 134/88 | HR 76 | Ht 62.0 in | Wt 156.4 lb

## 2022-09-30 DIAGNOSIS — I10 Essential (primary) hypertension: Secondary | ICD-10-CM | POA: Diagnosis not present

## 2022-09-30 DIAGNOSIS — R7303 Prediabetes: Secondary | ICD-10-CM | POA: Diagnosis not present

## 2022-09-30 DIAGNOSIS — G8929 Other chronic pain: Secondary | ICD-10-CM | POA: Diagnosis not present

## 2022-09-30 DIAGNOSIS — M25561 Pain in right knee: Secondary | ICD-10-CM

## 2022-09-30 DIAGNOSIS — E538 Deficiency of other specified B group vitamins: Secondary | ICD-10-CM | POA: Diagnosis not present

## 2022-09-30 DIAGNOSIS — M25562 Pain in left knee: Secondary | ICD-10-CM

## 2022-09-30 DIAGNOSIS — E559 Vitamin D deficiency, unspecified: Secondary | ICD-10-CM | POA: Diagnosis not present

## 2022-09-30 LAB — BASIC METABOLIC PANEL
BUN: 14 mg/dL (ref 6–23)
CO2: 29 mEq/L (ref 19–32)
Calcium: 10 mg/dL (ref 8.4–10.5)
Chloride: 104 mEq/L (ref 96–112)
Creatinine, Ser: 0.9 mg/dL (ref 0.40–1.20)
GFR: 73.86 mL/min (ref >60.00)
Glucose, Bld: 87 mg/dL (ref 70–99)
Potassium: 4.2 mEq/L (ref 3.5–5.1)
Sodium: 141 mEq/L (ref 135–145)

## 2022-09-30 LAB — CBC WITH DIFFERENTIAL/PLATELET
Basophils Absolute: 0 10*3/uL (ref 0.0–0.1)
Basophils Relative: 0.8 % (ref 0.0–3.0)
Eosinophils Absolute: 0 10*3/uL (ref 0.0–0.7)
Eosinophils Relative: 1.2 % (ref 0.0–5.0)
HCT: 40.4 % (ref 36.0–46.0)
Hemoglobin: 13.2 g/dL (ref 12.0–15.0)
Lymphocytes Relative: 45.3 % (ref 12.0–46.0)
Lymphs Abs: 1.6 10*3/uL (ref 0.7–4.0)
MCHC: 32.6 g/dL (ref 30.0–36.0)
MCV: 91.1 fl (ref 78.0–100.0)
Monocytes Absolute: 0.4 10*3/uL (ref 0.1–1.0)
Monocytes Relative: 10.5 % (ref 3.0–12.0)
Neutro Abs: 1.5 10*3/uL (ref 1.4–7.7)
Neutrophils Relative %: 42.2 % — ABNORMAL LOW (ref 43.0–77.0)
Platelets: 206 10*3/uL (ref 150.0–400.0)
RBC: 4.44 Mil/uL (ref 3.87–5.11)
RDW: 14.8 % (ref 11.5–15.5)
WBC: 3.5 10*3/uL — ABNORMAL LOW (ref 4.0–10.5)

## 2022-09-30 LAB — TSH: TSH: 1.19 u[IU]/mL (ref 0.35–5.50)

## 2022-09-30 LAB — MICROALBUMIN / CREATININE URINE RATIO
Creatinine,U: 164.1 mg/dL
Microalb Creat Ratio: 0.5 mg/g (ref 0.0–30.0)
Microalb, Ur: 0.8 mg/dL (ref 0.0–1.9)

## 2022-09-30 LAB — LIPID PANEL
Cholesterol: 243 mg/dL — ABNORMAL HIGH (ref 0–200)
HDL: 91.3 mg/dL (ref >39.00)
LDL Cholesterol: 140 mg/dL — ABNORMAL HIGH (ref 0–99)
NonHDL: 151.91
Total CHOL/HDL Ratio: 3
Triglycerides: 60 mg/dL (ref 0.0–149.0)
VLDL: 12 mg/dL (ref 0.0–40.0)

## 2022-09-30 LAB — URINALYSIS, ROUTINE W REFLEX MICROSCOPIC
Bilirubin Urine: NEGATIVE
Hgb urine dipstick: NEGATIVE
Ketones, ur: NEGATIVE
Leukocytes,Ua: NEGATIVE
Nitrite: NEGATIVE
RBC / HPF: NONE SEEN (ref 0–?)
Specific Gravity, Urine: >=1.03 — AB (ref 1.000–1.030)
Total Protein, Urine: NEGATIVE
Urine Glucose: NEGATIVE
Urobilinogen, UA: 0.2 (ref 0.0–1.0)
pH: 6 (ref 5.0–8.0)

## 2022-09-30 LAB — VITAMIN D 25 HYDROXY (VIT D DEFICIENCY, FRACTURES): VITD: 34.41 ng/mL (ref 30.00–100.00)

## 2022-09-30 LAB — HEPATIC FUNCTION PANEL
ALT: 51 U/L — ABNORMAL HIGH (ref 0–35)
AST: 53 U/L — ABNORMAL HIGH (ref 0–37)
Albumin: 4.2 g/dL (ref 3.5–5.2)
Alkaline Phosphatase: 71 U/L (ref 39–117)
Bilirubin, Direct: 0.1 mg/dL (ref 0.0–0.3)
Total Bilirubin: 0.6 mg/dL (ref 0.2–1.2)
Total Protein: 8.2 g/dL (ref 6.0–8.3)

## 2022-09-30 LAB — VITAMIN B12: Vitamin B-12: 373 pg/mL (ref 211–911)

## 2022-09-30 LAB — HEMOGLOBIN A1C: Hgb A1c MFr Bld: 5.6 % (ref 4.6–6.5)

## 2022-09-30 NOTE — Patient Instructions (Addendum)
Thank you for coming in today.  Please get an Xray today before you leave  You received an injection today. Seek immediate medical attention if the joint becomes red, extremely painful, or is oozing fluid.   If we need to do PT let me know.  We could find a location in [place] if needed.

## 2022-09-30 NOTE — Progress Notes (Signed)
I, Stevenson Clinch, CMA acting as a scribe for Clementeen Graham, MD.  Summer Robinson is a 53 y.o. female who presents to [facility] at [facility] today for bilat knee pain. Pt was seen by Dr. Denyse Amass in 2022 for [name] and [name] pain.  Today, pt c/o bilat knee pain x 2-3 weeks. Pt locates pain to joint line, deep to patella. Denies past or recent injury.  Pain is worse going up and down stairs.  Knee swelling: no Mechanical symptoms: popping, R>L Radiates:  no Aggravates: stairs Treatments tried: IBU, Voltaren  Dx imaging: 08/21/20 [name] XR  Pertinent review of systems: No fevers or chills  Relevant historical information: Hypertension   Exam:  BP 134/88   Pulse 76   Ht 5\' 2"  (1.575 m)   Wt 156 lb 6.4 oz (70.9 kg)   LMP 08/22/2018   SpO2 100%   BMI 28.61 kg/m  General: Well Developed, well nourished, and in no acute distress.   MSK: Right knee normal-appearing Normal motion with crepitation.  Tender palpation anterior knee. Stable ligamentous exam. Intact strength.  Left knee: Normal-appearing Normal motion with crepitation.  Tender palpation anterior knee. Stable ligamentous exam. Intact strength.    Lab and Radiology Results  Procedure: Real-time Ultrasound Guided Injection of right knee joint superior lateral patellar space Device: Philips Affiniti 50G Images permanently stored and available for review in PACS Verbal informed consent obtained.  Discussed risks and benefits of procedure. Warned about infection, bleeding, hyperglycemia damage to structures among others. Patient expresses understanding and agreement Time-out conducted.   Noted no overlying erythema, induration, or other signs of local infection.   Skin prepped in a sterile fashion.   Local anesthesia: Topical Ethyl chloride.   With sterile technique and under real time ultrasound guidance: 40 mg of Kenalog and 2 mL of Marcaine injected into knee joint. Fluid seen entering the  joint capsule.   Completed without difficulty   Pain immediately resolved suggesting accurate placement of the medication.   Advised to call if fevers/chills, erythema, induration, drainage, or persistent bleeding.   Images permanently stored and available for review in the ultrasound unit.  Impression: Technically successful ultrasound guided injection.    Procedure: Real-time Ultrasound Guided Injection of left knee joint superior lateral patellar space Device: Philips Affiniti 50G Images permanently stored and available for review in PACS Verbal informed consent obtained.  Discussed risks and benefits of procedure. Warned about infection, bleeding, hyperglycemia damage to structures among others. Patient expresses understanding and agreement Time-out conducted.   Noted no overlying erythema, induration, or other signs of local infection.   Skin prepped in a sterile fashion.   Local anesthesia: Topical Ethyl chloride.   With sterile technique and under real time ultrasound guidance: 40 mg of Kenalog and 2 mL of Marcaine injected into knee joint. Fluid seen entering the joint capsule.   Completed without difficulty   Pain immediately resolved suggesting accurate placement of the medication.   Advised to call if fevers/chills, erythema, induration, drainage, or persistent bleeding.   Images permanently stored and available for review in the ultrasound unit.  Impression: Technically successful ultrasound guided injection.   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  No acute fractures are visible.  Left knee: Mild medial and patellofemoral DJD.  No acute fractures are visible.  Await formal radiology review    Assessment and Plan: 52 y.o. female with bilateral anterior  knee pain right worse than left.  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  We discussed options.   Plan for steroid injection.  Recheck back as needed.  Consider physical therapy in the future if needed.  She lives in [place] works in [place] so PT will be challenging.   PDMP not reviewed this encounter. Orders Placed This Encounter  Procedures   [place] LIMITED JOINT SPACE STRUCTURES LOW BILAT(NO LINKED CHARGES)    Order Specific Question:   Reason for Exam (SYMPTOM  OR DIAGNOSIS REQUIRED)    Answer:   bilat knee pain    Order Specific Question:   Preferred imaging location?    Answer:   [facility] Knee AP/LAT W/Sunrise Left    Standing Status:   Future    Number of Occurrences:   1    Standing Expiration Date:   10/31/2022    Order Specific Question:   Reason for Exam (SYMPTOM  OR DIAGNOSIS REQUIRED)    Answer:   bilat knee pain    Order Specific Question:   Preferred imaging location?    Answer:   [facility]    Order Specific Question:   Is patient pregnant?    Answer:   No   DG Knee AP/LAT W/Sunrise Right    Standing Status:   Future    Number of Occurrences:   1    Standing Expiration Date:   10/31/2022    Order Specific Question:   Reason for Exam (SYMPTOM  OR DIAGNOSIS REQUIRED)    Answer:   bilat knee pain    Order Specific Question:   Preferred imaging location?    Answer:   [facility]    Order Specific Question:   Is patient pregnant?    Answer:   No   No orders of the defined types were placed in this encounter.    Discussed warning signs or symptoms. Please see discharge instructions. Patient expresses understanding.   The above documentation has been reviewed and is accurate and complete Clementeen Graham, M.D.

## 2022-10-01 LAB — FOOD ALLERGY PROFILE
Allergen, Salmon, f41: <0.1 kU/L
Almonds: <0.1 kU/L
CLASS: 0
CLASS: 0
CLASS: 0
CLASS: 0
CLASS: 0
CLASS: 0
CLASS: 0
CLASS: 0
CLASS: 0
CLASS: 0
Cashew IgE: <0.1 kU/L
Class: 0
Class: 0
Class: 0
Class: 1
Egg White IgE: <0.1 kU/L
Fish Cod: <0.1 kU/L
Hazelnut: <0.1 kU/L
Milk IgE: <0.1 kU/L
Peanut IgE: <0.1 kU/L
Scallop IgE: <0.1 kU/L
Sesame Seed f10: 0.23 kU/L — ABNORMAL HIGH
Shrimp IgE: 0.62 kU/L — ABNORMAL HIGH
Soybean IgE: <0.1 kU/L
Tuna IgE: <0.1 kU/L
Walnut: <0.1 kU/L
Wheat IgE: <0.1 kU/L

## 2022-10-01 LAB — INTERPRETATION:

## 2022-10-04 ENCOUNTER — Encounter: Payer: Self-pay | Admitting: Internal Medicine

## 2022-10-04 NOTE — Progress Notes (Signed)
Left knee x-ray shows arthritis changes in the knee.

## 2022-10-04 NOTE — Progress Notes (Signed)
Right knee x-ray shows arthritis changes.  The right knee is worse than the left knee.

## 2022-10-05 MED ORDER — TRULANCE 3 MG PO TABS: ORAL_TABLET | ORAL | 3 refills | Status: DC

## 2022-10-08 ENCOUNTER — Other Ambulatory Visit (HOSPITAL_COMMUNITY): Payer: Self-pay

## 2022-10-10 LAB — REGIONAL PANEL 2
012-IgE Goldenrod: <0.1 kU/L
Amer Sycamore IgE Qn: <0.1 kU/L
Bahia Grass IgE: <0.1 kU/L
Bermuda Grass IgE: <0.1 kU/L
Cottonwood IgE: 0.14 kU/L — AB
Elm, American IgE: <0.1 kU/L
Hickory, White IgE: <0.1 kU/L
Johnson Grass IgE: <0.1 kU/L
Kentucky Bluegrass IgE: <0.1 kU/L
Lamb's Quarters IgE: 0.91 kU/L — AB
Maple/Box Elder IgE: 0.1 kU/L — AB
Oak, White IgE: <0.1 kU/L
Pigweed, Rough IgE: <0.1 kU/L
Plantain, English IgE: <0.1 kU/L
Ragweed, Short IgE: <0.1 kU/L
T005-IgE Beech (American): <0.1 kU/L
T010-IgE Walnut: <0.1 kU/L
T012-IgE Willow: 0.25 kU/L — AB
T015-IgE Ash, White: <0.1 kU/L
W004-IgE Ragweed, False: <0.1 kU/L

## 2022-10-12 ENCOUNTER — Other Ambulatory Visit (HOSPITAL_COMMUNITY): Payer: Self-pay

## 2022-10-12 ENCOUNTER — Telehealth: Payer: Self-pay

## 2022-10-12 NOTE — Telephone Encounter (Signed)
Pharmacy Patient Advocate Encounter   Received notification that prior authorization for Trulance 3mg  tabs is required/requested.   PA submitted on 10/12/22 to (ins) [employer] via [employer]  # [id number] Status is pending

## 2022-10-12 NOTE — Telephone Encounter (Signed)
Patient Advocate Encounter  Prior Authorization for Trulance has been approved with [employer] [employer].    PA# [id number] Effective dates: 10/12/22 through 10/11/25  Per [facility] test claim, copay for 30 days supply is $363.16 (with eVoucher)

## 2022-10-28 ENCOUNTER — Ambulatory Visit: Payer: BC Managed Care – PPO | Admitting: Internal Medicine

## 2022-10-29 ENCOUNTER — Encounter: Payer: Self-pay | Admitting: Internal Medicine

## 2022-11-01 ENCOUNTER — Ambulatory Visit: Payer: BC Managed Care – PPO | Admitting: Internal Medicine

## 2022-11-09 ENCOUNTER — Telehealth (INDEPENDENT_AMBULATORY_CARE_PROVIDER_SITE_OTHER): Payer: BC Managed Care – PPO | Admitting: Internal Medicine

## 2022-11-09 DIAGNOSIS — R7303 Prediabetes: Secondary | ICD-10-CM

## 2022-11-09 DIAGNOSIS — H1032 Unspecified acute conjunctivitis, left eye: Secondary | ICD-10-CM | POA: Diagnosis not present

## 2022-11-09 DIAGNOSIS — F419 Anxiety disorder, unspecified: Secondary | ICD-10-CM

## 2022-11-09 MED ORDER — ERYTHROMYCIN 5 MG/GM OP OINT: 1.0000 | TOPICAL_OINTMENT | Freq: Three times a day (TID) | OPHTHALMIC | 1 refills | Status: DC

## 2022-11-09 MED ORDER — SERTRALINE HCL 100 MG PO TABS
100.0000 mg | ORAL_TABLET | Freq: Every day | ORAL | 3 refills | Status: DC
Start: 2022-11-09 — End: 2023-08-29

## 2022-11-09 MED ORDER — ALPRAZOLAM 0.5 MG PO TABS: 0.5000 mg | ORAL_TABLET | Freq: Two times a day (BID) | ORAL | 1 refills | Status: DC | PRN

## 2022-11-09 NOTE — Progress Notes (Signed)
Patient ID: Summer Robinson, female   DOB: 02/19/1971, 52 y.o.   MRN: [id number]  Virtual Visit via Video Note  I connected with Summer Robinson on 11/13/22 at 10:20 AM EDT by a video enabled telemedicine application and verified that I am speaking with the correct person using two identifiers.  Location of all participants today Patient: at home Provider: at office   I discussed the limitations of evaluation and management by telemedicine and the availability of in person appointments. The patient expressed understanding and agreed to proceed.  History of Present Illness: Here with c/o 3 days onset left eye redness, swelling and slight off colored d/c, but denies fever, chills, vision change.  Also Denies worsening depressive symptoms, suicidal ideation, or panic; has ongoing anxiety, with much increased recently due to multiple social stressors.  Pt denies chest pain, increased sob or doe, wheezing, orthopnea, PND, increased LE swelling, palpitations, dizziness or syncope.   Pt denies polydipsia, polyuria, or new focal neuro s/s.    Pt denies fever, wt loss, night sweats, loss of appetite, or other constitutional symptoms   Past Medical History:  Diagnosis Date   GERD (gastroesophageal reflux disease)    Hay fever    History reviewed. No pertinent surgical history.  reports that she has never smoked. She has never used smokeless tobacco. She reports current alcohol use. She reports that she does not use drugs. family history includes Cancer in her father; Diabetes in her mother; High Cholesterol in her mother; High blood pressure in her mother; Miscarriages / Stillbirths in her mother. No Known Allergies Current Outpatient Medications on File Prior to Visit  Medication Sig Dispense Refill   cetirizine (ZYRTEC) 10 MG tablet Take 1 tablet (10 mg total) by mouth daily. 30 tablet 11   ibuprofen (ADVIL) 800 MG tablet Take 1 tablet (800 mg total) by mouth every 8 (eight) hours as needed. 30  tablet 3   omeprazole (PRILOSEC) 40 MG capsule Take 1 capsule (40 mg total) by mouth daily. Please keep May 9th appt for future refills 90 capsule 3   Plecanatide (TRULANCE) 3 MG TABS 1 tab by mouth once daily as needed 90 tablet 3   scopolamine (TRANSDERM SCOP, 1.5 MG,) 1 MG/3DAYS Place 1 patch (1.5 mg total) onto the skin every 3 (three) days. 10 patch 2   Serdexmethylphen-Dexmethylphen (AZSTARYS) 26.1-5.2 MG CAPS 1 by mouth per day 30 capsule 0   triamcinolone (NASACORT) 55 MCG/ACT AERO nasal inhaler Place 2 sprays into the nose daily. 1 each 12   triamcinolone ointment (KENALOG) 0.1 % Apply 1 application topically 2 (two) times daily. 453.6 g 0   No current facility-administered medications on file prior to visit.    Observations/Objective: Alert, NAD, appropriate mood and affect, resps normal, cn 2-12 intact, moves all 4s, no visible rash or swelling; left conjunctiva with marked erythema Recent Results (from the past 2160 hour(s))  Food Allergy Profile     Status: Abnormal   Collection Time: 09/30/22 12:46 PM  Result Value Ref Range   Egg White IgE <0.10 kU/L   Class 0    Peanut IgE <0.10 kU/L   Class 0    Wheat IgE <0.10 kU/L   CLASS 0    Walnut <0.10 kU/L   CLASS 0    Fish Cod <0.10 kU/L   CLASS 0    Milk IgE <0.10 kU/L   Class 0    Soybean IgE <0.10 kU/L   CLASS 0    Shrimp IgE 0.62 (  H) kU/L   Class 1    Scallop IgE <0.10 kU/L   CLASS 0    Sesame Seed f10  0.23 (H) kU/L   CLASS 0/1    Hazelnut <0.10 kU/L   CLASS 0    Cashew IgE <0.10 kU/L   CLASS 0    Almonds <0.10 kU/L   CLASS 0    Allergen, Salmon, f41 <0.10 kU/L   CLASS 0    Tuna IgE <0.10 kU/L   CLASS 0   REGIONAL PANEL 2     Status: Abnormal   Collection Time: 09/30/22 12:46 PM  Result Value Ref Range   Class Description Allergens Comment     Comment:     Levels of Specific IgE       Class  Description of Class     ---------------------------  -----  --------------------                    < 0.10          0         Negative            0.10 -    0.31         0/I       Equivocal/Low            0.32 -    0.55         I         Low            0.56 -    1.40         II        Moderate            1.41 -    3.90         III       High            3.91 -   19.00         IV        Very High           19.01 -  100.00         V         Very High                   >100.00         VI        Very High    [place] Grass IgE <0.10 Class 0 kU/L   [place] Bluegrass IgE <0.10 Class 0 kU/L   [place] Grass IgE <0.10 Class 0 kU/L   [place] Grass IgE <0.10 Class 0 kU/L   Maple/Box Elder IgE 0.10 (A) Class 0/I kU/L   T005-IgE Beech (American) <0.10 Class 0 kU/L   Oak, White IgE <0.10 Class 0 kU/L   Elm, American IgE <0.10 Class 0 kU/L   T010-IgE Walnut <0.10 Class 0 kU/L   Amer Sycamore IgE Qn <0.10 Class 0 kU/L   T012-IgE Willow 0.25 (A) Class 0/I kU/L   C[place] IgE 0.14 (A) Class 0/I kU/L   T015-IgE Ash, White <0.10 Class 0 kU/L   Hickory, White IgE <0.10 Class 0 kU/L   Ragweed, Short IgE <0.10 Class 0 kU/L   W004-IgE Ragweed, False <0.10 Class 0 kU/L   Plantain, English IgE <0.10 Class 0 kU/L   Lamb's Quarters IgE 0.91 (A) Class II kU/L   012-IgE Goldenrod <0.10 Class 0 kU/L  Cocklebur IgE CANCELED kU/L    Comment: Test not performed. Insufficient specimen to perform or complete analysis.  Result canceled by the ancillary.    Pigweed, Rough IgE <0.10 Class 0 kU/L  VITAMIN D 25 Hydroxy (Vit-D Deficiency, Fractures)     Status: None   Collection Time: 09/30/22 12:46 PM  Result Value Ref Range   VITD 34.41 30.00 - 100.00 ng/mL  Vitamin B12     Status: None   Collection Time: 09/30/22 12:46 PM  Result Value Ref Range   Vitamin B-12 373 211 - 911 pg/mL  Basic metabolic panel     Status: None   Collection Time: 09/30/22 12:46 PM  Result Value Ref Range   Sodium 141 135 - 145 mEq/L   Potassium 4.2 3.5 - 5.1 mEq/L   Chloride 104 96 - 112 mEq/L   CO2 29 19 - 32 mEq/L   Glucose, Bld 87 70 - 99  mg/dL   BUN 14 6 - 23 mg/dL   Creatinine, Ser 0.86 0.40 - 1.20 mg/dL   GFR 57.84 >69.62 mL/min    Comment: Calculated using the CKD-EPI Creatinine Equation (2021)   Calcium 10.0 8.4 - 10.5 mg/dL  Urinalysis, Routine w reflex microscopic     Status: Abnormal   Collection Time: 09/30/22 12:46 PM  Result Value Ref Range   Color, Urine YELLOW Yellow;Lt. Yellow;Straw;Dark Yellow;Amber;Green;Red;Brown   APPearance CLEAR Clear;Turbid;Slightly Cloudy;Cloudy   Specific Gravity, Urine >=1.030 (A) 1.000 - 1.030   pH 6.0 5.0 - 8.0   Total Protein, Urine NEGATIVE Negative   Urine Glucose NEGATIVE Negative   Ketones, ur NEGATIVE Negative   Bilirubin Urine NEGATIVE Negative   Hgb urine dipstick NEGATIVE Negative   Urobilinogen, UA 0.2 0.0 - 1.0   Leukocytes,Ua NEGATIVE Negative   Nitrite NEGATIVE Negative   WBC, UA 0-2/hpf 0-2/hpf   RBC / HPF none seen 0-2/hpf   Mucus, UA Presence of (A) None   Squamous Epithelial / HPF Rare(0-4/hpf) Rare(0-4/hpf)  TSH     Status: None   Collection Time: 09/30/22 12:46 PM  Result Value Ref Range   TSH 1.19 0.35 - 5.50 uIU/mL  CBC with Differential/Platelet     Status: Abnormal   Collection Time: 09/30/22 12:46 PM  Result Value Ref Range   WBC 3.5 (L) 4.0 - 10.5 K/uL   RBC 4.44 3.87 - 5.11 Mil/uL   Hemoglobin 13.2 12.0 - 15.0 g/dL   HCT 95.2 84.1 - 32.4 %   MCV 91.1 78.0 - 100.0 fl   MCHC 32.6 30.0 - 36.0 g/dL   RDW 40.1 02.7 - 25.3 %   Platelets 206.0 150.0 - 400.0 K/uL   Neutrophils Relative % 42.2 (L) 43.0 - 77.0 %   Lymphocytes Relative 45.3 12.0 - 46.0 %   Monocytes Relative 10.5 3.0 - 12.0 %   Eosinophils Relative 1.2 0.0 - 5.0 %   Basophils Relative 0.8 0.0 - 3.0 %   Neutro Abs 1.5 1.4 - 7.7 K/uL   Lymphs Abs 1.6 0.7 - 4.0 K/uL   Monocytes Absolute 0.4 0.1 - 1.0 K/uL   Eosinophils Absolute 0.0 0.0 - 0.7 K/uL   Basophils Absolute 0.0 0.0 - 0.1 K/uL  Hepatic function panel     Status: Abnormal   Collection Time: 09/30/22 12:46 PM  Result  Value Ref Range   Total Bilirubin 0.6 0.2 - 1.2 mg/dL   Bilirubin, Direct 0.1 0.0 - 0.3 mg/dL   Alkaline Phosphatase 71 39 - 117 U/L   AST 53 (  H) 0 - 37 U/L   ALT 51 (H) 0 - 35 U/L   Total Protein 8.2 6.0 - 8.3 g/dL   Albumin 4.2 3.5 - 5.2 g/dL  Lipid panel     Status: Abnormal   Collection Time: 09/30/22 12:46 PM  Result Value Ref Range   Cholesterol 243 (H) 0 - 200 mg/dL    Comment: ATP III Classification       Desirable:  < 200 mg/dL               Borderline High:  200 - 239 mg/dL          High:  > = 161 mg/dL   Triglycerides 09.6 0.0 - 149.0 mg/dL    Comment: Normal:  <045 mg/dLBorderline High:  150 - 199 mg/dL   HDL 40.98 >11.91 mg/dL   VLDL 47.8 0.0 - 29.5 mg/dL   LDL Cholesterol 621 (H) 0 - 99 mg/dL   Total CHOL/HDL Ratio 3     Comment:                Men          Women1/2 Average Risk     3.4          3.3Average Risk          5.0          4.42X Average Risk          9.6          7.13X Average Risk          15.0          11.0                       NonHDL 151.91     Comment: NOTE:  Non-HDL goal should be 30 mg/dL higher than patient's LDL goal (i.e. LDL goal of < 70 mg/dL, would have non-HDL goal of < 100 mg/dL)  Hemoglobin H0Q     Status: None   Collection Time: 09/30/22 12:46 PM  Result Value Ref Range   Hgb A1c MFr Bld 5.6 4.6 - 6.5 %    Comment: Glycemic Control Guidelines for People with Diabetes:Non Diabetic:  <6%Goal of Therapy: <7%Additional Action Suggested:  >8%   Microalbumin / creatinine urine ratio     Status: None   Collection Time: 09/30/22 12:46 PM  Result Value Ref Range   Microalb, Ur 0.8 0.0 - 1.9 mg/dL   Creatinine,U 657.8 mg/dL   Microalb Creat Ratio 0.5 0.0 - 30.0 mg/g  Interpretation:     Status: None   Collection Time: 09/30/22 12:46 PM  Result Value Ref Range   Interpretation      Comment: . Specific                        Level of Allergen IGE Class      kU/L             Specific IGE Antibody  -----         ---------        -------------------    0              <0.10           Absent/Undetectable   0/1        0.10-0.34           Very Low Level   1  0.35-0.69           Low Level   2          0.70-3.49           Moderate Level   3          3.50-17.4           High Level   4          17.5-49.9           Very High Level   5            50-100            Very High Level   6              >100            Very High Level . The clinical relevance of allergen results of 0.10-0.34 kU/L are undetermined and intended for  specialist use. . Allergens denoted with a "**" include results using one or more analyte specific reagents. In those cases, the test was developed and its analytical performance characteristics have been determined by [employer]. It has not been cleared or approved by the U.S. Food and Drug Administration. This assay  has been v alidated pursuant to the [employer]  and is used for clinical purposes.    Assessment and Plan: See notes  Follow Up Instructions: See notes   I discussed the assessment and treatment plan with the patient. The patient was provided an opportunity to ask questions and all were answered. The patient agreed with the plan and demonstrated an understanding of the instructions.   The patient was advised to call back or seek an in-person evaluation if the symptoms worsen or if the condition fails to improve as anticipated.  Oliver Barre, MD

## 2022-11-12 ENCOUNTER — Ambulatory Visit: Payer: BC Managed Care – PPO | Admitting: Internal Medicine

## 2022-11-12 ENCOUNTER — Telehealth: Payer: BC Managed Care – PPO | Admitting: Internal Medicine

## 2022-11-13 ENCOUNTER — Encounter: Payer: Self-pay | Admitting: Internal Medicine

## 2022-11-13 DIAGNOSIS — F419 Anxiety disorder, unspecified: Secondary | ICD-10-CM | POA: Insufficient documentation

## 2022-11-13 DIAGNOSIS — H1032 Unspecified acute conjunctivitis, left eye: Secondary | ICD-10-CM | POA: Insufficient documentation

## 2022-11-13 NOTE — Patient Instructions (Signed)
Please take all new medication as prescribed 

## 2022-11-13 NOTE — Assessment & Plan Note (Signed)
Lab Results  Component Value Date   HGBA1C 5.6 09/30/2022   Stable, pt to continue current medical treatment  - diet, wt control

## 2022-11-13 NOTE — Assessment & Plan Note (Signed)
Mild to mod, for antibx course - emycin opth ointment asd  to f/u any worsening symptoms or concerns

## 2022-11-13 NOTE — Assessment & Plan Note (Signed)
Uncontrolled, for increased zoloft 100 every day, and xanax 0.5 mg prn,  to f/u any worsening symptoms or concerns, declines counseling referral

## 2022-12-25 ENCOUNTER — Other Ambulatory Visit: Payer: Self-pay | Admitting: Internal Medicine

## 2023-01-07 ENCOUNTER — Telehealth (INDEPENDENT_AMBULATORY_CARE_PROVIDER_SITE_OTHER): Payer: BC Managed Care – PPO | Admitting: Internal Medicine

## 2023-01-07 DIAGNOSIS — M25561 Pain in right knee: Secondary | ICD-10-CM | POA: Diagnosis not present

## 2023-01-07 DIAGNOSIS — R635 Abnormal weight gain: Secondary | ICD-10-CM | POA: Diagnosis not present

## 2023-01-07 DIAGNOSIS — R7303 Prediabetes: Secondary | ICD-10-CM

## 2023-01-07 DIAGNOSIS — M25562 Pain in left knee: Secondary | ICD-10-CM

## 2023-01-07 DIAGNOSIS — G8929 Other chronic pain: Secondary | ICD-10-CM

## 2023-01-07 DIAGNOSIS — E559 Vitamin D deficiency, unspecified: Secondary | ICD-10-CM

## 2023-01-07 DIAGNOSIS — F419 Anxiety disorder, unspecified: Secondary | ICD-10-CM

## 2023-01-07 MED ORDER — TOPIRAMATE 25 MG PO TABS: 25.0000 mg | ORAL_TABLET | Freq: Two times a day (BID) | ORAL | 11 refills | Status: DC

## 2023-01-07 NOTE — Progress Notes (Unsigned)
Patient ID: Summer Robinson, female   DOB: January 20, 1971, 52 y.o.   MRN: [id number]  Virtual Visit via Video Note  I connected with Bennie Pierini on 01/11/23 at 10:20 AM EDT by a video enabled telemedicine application and verified that I am speaking with the correct person using two identifiers.  Location of all participants today Patient: at work Provider: at office   I discussed the limitations of evaluation and management by telemedicine and the availability of in person appointments. The patient expressed understanding and agreed to proceed.  History of Present Illness: Here to f/u persistent obesity with worsening wt gain, has been off wegovy as not covered any long by insurance with increased appetite and wt gain at least 25 lbs in last 4 months., depiste attention to diet and activity.  Wt seems to up to 170's again  Has new job at half pay as she was required to keep it.  Many laid off.  Pt denies chest pain, increased sob or doe, wheezing, orthopnea, PND, increased LE swelling, palpitations, dizziness or syncope.   Pt denies polydipsia, polyuria, or new focal neuro s/s.    Pt denies fever, wt loss, night sweats, loss of appetite, or other constitutional symptoms   Past Medical History:  Diagnosis Date   GERD (gastroesophageal reflux disease)    Hay fever    History reviewed. No pertinent surgical history.  reports that she has never smoked. She has never used smokeless tobacco. She reports current alcohol use. She reports that she does not use drugs. family history includes Cancer in her father; Diabetes in her mother; High Cholesterol in her mother; High blood pressure in her mother; Miscarriages / Stillbirths in her mother. No Known Allergies Current Outpatient Medications on File Prior to Visit  Medication Sig Dispense Refill   ALPRAZolam (XANAX) 0.5 MG tablet Take 1 tablet (0.5 mg total) by mouth 2 (two) times daily as needed for anxiety. 40 tablet 1   cetirizine (ZYRTEC) 10  MG tablet Take 1 tablet (10 mg total) by mouth daily. 30 tablet 11   erythromycin ophthalmic ointment Place 1 Application into both eyes 3 (three) times daily. 3.5 g 1   ibuprofen (ADVIL) 800 MG tablet Take 1 tablet (800 mg total) by mouth every 8 (eight) hours as needed. 30 tablet 3   omeprazole (PRILOSEC) 40 MG capsule Take 1 capsule (40 mg total) by mouth daily. 90 capsule 2   Plecanatide (TRULANCE) 3 MG TABS 1 tab by mouth once daily as needed 90 tablet 3   scopolamine (TRANSDERM SCOP, 1.5 MG,) 1 MG/3DAYS Place 1 patch (1.5 mg total) onto the skin every 3 (three) days. 10 patch 2   Serdexmethylphen-Dexmethylphen (AZSTARYS) 26.1-5.2 MG CAPS 1 by mouth per day 30 capsule 0   sertraline (ZOLOFT) 100 MG tablet Take 1 tablet (100 mg total) by mouth daily. 90 tablet 3   triamcinolone (NASACORT) 55 MCG/ACT AERO nasal inhaler Place 2 sprays into the nose daily. 1 each 12   triamcinolone ointment (KENALOG) 0.1 % Apply 1 application topically 2 (two) times daily. 453.6 g 0   No current facility-administered medications on file prior to visit.    Observations/Objective: Alert, NAD, appropriate mood and affect, resps normal, cn 2-12 intact, moves all 4s, no visible rash or swelling  Denies worsening depressive symptoms, suicidal ideation, or panic; has ongoing anxiety, Lab Results  Component Value Date   WBC 3.5 (L) 09/30/2022   HGB 13.2 09/30/2022   HCT 40.4 09/30/2022   PLT 206.0  09/30/2022   GLUCOSE 87 09/30/2022   CHOL 243 (H) 09/30/2022   TRIG 60.0 09/30/2022   HDL 91.30 09/30/2022   LDLCALC 140 (H) 09/30/2022   ALT 51 (H) 09/30/2022   AST 53 (H) 09/30/2022   NA 141 09/30/2022   K 4.2 09/30/2022   CL 104 09/30/2022   CREATININE 0.90 09/30/2022   BUN 14 09/30/2022   CO2 29 09/30/2022   TSH 1.19 09/30/2022   HGBA1C 5.6 09/30/2022   MICROALBUR 0.8 09/30/2022   Assessment and Plan: See notes  Follow Up Instructions: See notes   I discussed the assessment and treatment plan with  the patient. The patient was provided an opportunity to ask questions and all were answered. The patient agreed with the plan and demonstrated an understanding of the instructions.   The patient was advised to call back or seek an in-person evaluation if the symptoms worsen or if the condition fails to improve as anticipated.  Oliver Barre, MD

## 2023-01-11 ENCOUNTER — Encounter: Payer: Self-pay | Admitting: Internal Medicine

## 2023-01-11 DIAGNOSIS — R635 Abnormal weight gain: Secondary | ICD-10-CM | POA: Insufficient documentation

## 2023-01-11 NOTE — Assessment & Plan Note (Signed)
Worsening with recent wt gain, unable for surgury until wt loss, for nsaid prn

## 2023-01-11 NOTE — Assessment & Plan Note (Signed)
Worsening, for topamax 25 bid,  to f/u any worsening symptoms or concerns

## 2023-01-11 NOTE — Assessment & Plan Note (Signed)
Overall stable, declines need for change in tx or counseling

## 2023-01-11 NOTE — Patient Instructions (Signed)
Please take all new medication as prescribed 

## 2023-01-11 NOTE — Assessment & Plan Note (Signed)
Lab Results  Component Value Date   HGBA1C 5.6 09/30/2022   Stable, pt to continue current medical treatment  - diet, wt control

## 2023-01-11 NOTE — Assessment & Plan Note (Signed)
Last vitamin D Lab Results  Component Value Date   VD25OH 34.41 09/30/2022   Low, to start oral replacement

## 2023-05-30 ENCOUNTER — Encounter: Payer: Self-pay | Admitting: Internal Medicine

## 2023-05-31 ENCOUNTER — Encounter: Payer: Self-pay | Admitting: Primary Care

## 2023-05-31 ENCOUNTER — Ambulatory Visit: Payer: 59 | Admitting: Primary Care

## 2023-05-31 ENCOUNTER — Ambulatory Visit: Payer: Self-pay | Admitting: Internal Medicine

## 2023-05-31 VITALS — BP 178/96 | HR 88 | Temp 97.8°F | Ht 62.0 in | Wt 189.0 lb

## 2023-05-31 DIAGNOSIS — R051 Acute cough: Secondary | ICD-10-CM | POA: Insufficient documentation

## 2023-05-31 MED ORDER — HYDROCOD POLI-CHLORPHE POLI ER 10-8 MG/5ML PO SUER
5.0000 mL | Freq: Two times a day (BID) | ORAL | 0 refills | Status: DC | PRN
Start: 2023-05-31 — End: 2023-08-29

## 2023-05-31 MED ORDER — AMOXICILLIN-POT CLAVULANATE 875-125 MG PO TABS
1.0000 | ORAL_TABLET | Freq: Two times a day (BID) | ORAL | 0 refills | Status: DC
Start: 2023-05-31 — End: 2023-08-29

## 2023-05-31 MED ORDER — PREDNISONE 20 MG PO TABS
ORAL_TABLET | ORAL | 0 refills | Status: DC
Start: 2023-05-31 — End: 2023-08-29

## 2023-05-31 NOTE — Patient Instructions (Addendum)
 Start Augmentin  antibiotics. Take 1 tablet by mouth twice daily for 7 days.  Start Tussionex cough suppressant. Take 5 ml every 12 hours as needed for cough and rest. Caution this medication contains codeine which may cause drowsiness.   Start prednisone  20 mg tablets. Take 2 tablets by mouth once daily in the morning for 5 days.  Continue taking omeprazole  40 mg for reflux.   It was a pleasure meeting you!

## 2023-05-31 NOTE — Progress Notes (Addendum)
 Subjective:    Patient ID: Summer Robinson, female    DOB: [birth year], 53 y.o.   MRN: [id number]  Cough Pertinent negatives include no chills, fever, headaches or sore throat.    Summer Robinson is a very pleasant 53 y.o. female patient of Dr. Norleen with a history of hypertension, prediabetes, anxiety, allergic rhinitis who presents today to discuss cough.  [facility]uated at [facility] on 05/19/2023 for 6-day history of URI symptoms including cough, chest congestion, fever of 101 during first day of illness.  She tested negative for COVID-19 infection.  She was treated conservatively with guaifenesin, Flonase, Tessalon  Perles.  Today she continues to experience an ongoing cough since 05/13/23. Also with chest congestion, chest soreness from coughing, fatigue. Her cough is worse at night when laying down, worse when talking, but is consistent throughout the day and evening.   She does have a history of GERD, takes omeprazole  40 mg daily. Feels well managed.   She denies a history of asthma. She is a non smoker. She denies fevers, chills, shortness of breath, chest tightness, wheezing.    Review of Systems  Constitutional:  Positive for fatigue. Negative for chills and fever.  HENT:  Positive for congestion. Negative for sore throat.   Respiratory:  Positive for cough.   Neurological:  Negative for headaches.         Past Medical History:  Diagnosis Date   GERD (gastroesophageal reflux disease)    Hay fever     Social History   Socioeconomic History   Marital status: Single    Spouse name: Not on file   Number of children: Not on file   Years of education: Not on file   Highest education level: Not on file  Occupational History   Not on file  Tobacco Use   Smoking status: Never   Smokeless tobacco: Never  Substance and Sexual Activity   Alcohol use: Yes    Comment: Socially    Drug use: No   Sexual activity: Not Currently    Partners: Female  Other Topics  Concern   Not on file  Social History Narrative   Not on file   Social Drivers of Health   Financial Resource Strain: Medium Risk (07/10/2021)   Received from [facility]   Overall Financial Resource Strain (CARDIA)    Difficulty of Paying Living Expenses: Somewhat hard  Food Insecurity: No Food Insecurity (07/10/2021)   Received from [facility], [facility]   Hunger Vital Sign    Worried About Running Out of Food in the Last Year: Never true    Ran Out of Food in the Last Year: Never true  Transportation Needs: No Transportation Needs (07/10/2021)   Received from [facility], [facility]   PRAPARE - Transportation    Lack of Transportation (Medical): No    Lack of Transportation (Non-Medical): No  Physical Activity: Insufficiently Active (07/10/2021)   Received from [facility]   Exercise Vital Sign    Days of Exercise per Week: 2 days    Minutes of Exercise per Session: 50 min  Stress: Stress Concern Present (07/10/2021)   Received from [facility] - Occupational Stress Questionnaire    Feeling of Stress : To some extent  Social Connections: Unknown (10/02/2021)   Received from [facility], [facility]   Social Network    Social Network: Not on file  Intimate Partner Violence: Unknown (08/24/2021)  Received from [facility], [facility]   HITS    Physically Hurt: Not on file    Insult or Talk Down To: Not on file    Threaten Physical Harm: Not on file    Scream or Curse: Not on file    History reviewed. No pertinent surgical history.  Family History  Problem Relation Age of Onset   Diabetes Mother    High blood pressure Mother    High Cholesterol Mother    Miscarriages / Stillbirths Mother    Cancer Father     No Known Allergies  Current Outpatient Medications on File Prior to Visit  Medication Sig Dispense Refill   ALPRAZolam  (XANAX ) 0.5 MG tablet Take 1  tablet (0.5 mg total) by mouth 2 (two) times daily as needed for anxiety. 40 tablet 1   erythromycin  ophthalmic ointment Place 1 Application into both eyes 3 (three) times daily. 3.5 g 1   ibuprofen  (ADVIL ) 800 MG tablet Take 1 tablet (800 mg total) by mouth every 8 (eight) hours as needed. 30 tablet 3   omeprazole  (PRILOSEC) 40 MG capsule Take 1 capsule (40 mg total) by mouth daily. 90 capsule 2   scopolamine  (TRANSDERM SCOP , 1.5 MG,) 1 MG/3DAYS Place 1 patch (1.5 mg total) onto the skin every 3 (three) days. 10 patch 2   Serdexmethylphen-Dexmethylphen (AZSTARYS ) 26.1-5.2 MG CAPS 1 by mouth per day 30 capsule 0   sertraline  (ZOLOFT ) 100 MG tablet Take 1 tablet (100 mg total) by mouth daily. 90 tablet 3   topiramate  (TOPAMAX ) 25 MG tablet Take 1 tablet (25 mg total) by mouth 2 (two) times daily. 60 tablet 11   triamcinolone  (NASACORT ) 55 MCG/ACT AERO nasal inhaler Place 2 sprays into the nose daily. 1 each 12   triamcinolone  ointment (KENALOG ) 0.1 % Apply 1 application topically 2 (two) times daily. 453.6 g 0   cetirizine  (ZYRTEC ) 10 MG tablet Take 1 tablet (10 mg total) by mouth daily. 30 tablet 11   Plecanatide  (TRULANCE ) 3 MG TABS 1 tab by mouth once daily as needed 90 tablet 3   No current facility-administered medications on file prior to visit.    BP (!) 178/96   Pulse 88   Temp 97.8 F (36.6 C) (Temporal)   Ht 5' 2 (1.575 m)   Wt 189 lb (85.7 kg)   LMP 08/22/2018   SpO2 98%   BMI 34.57 kg/m  Objective:   Physical Exam Constitutional:      Appearance: She is ill-appearing.  HENT:     Right Ear: Tympanic membrane and ear canal normal.     Left Ear: Tympanic membrane and ear canal normal.     Nose: No mucosal edema.     Right Sinus: No maxillary sinus tenderness or frontal sinus tenderness.     Left Sinus: No maxillary sinus tenderness or frontal sinus tenderness.     Mouth/Throat:     Mouth: Mucous membranes are moist.  Eyes:     Conjunctiva/sclera: Conjunctivae normal.   Cardiovascular:     Rate and Rhythm: Normal rate and regular rhythm.  Pulmonary:     Effort: Pulmonary effort is normal.     Breath sounds: Examination of the right-lower field reveals rhonchi. Examination of the left-lower field reveals rhonchi. Rhonchi present. No wheezing.     Comments: Persistent dry cough noted throughout visit Musculoskeletal:     Cervical back: Neck supple.  Skin:    General: Skin is warm and dry.  Assessment & Plan:  Acute cough Assessment & Plan: Given duration of symptoms, coupled with examination and presentation, will treat for presumed bacterial involvement.  Continue omeprazole  40 mg daily.  Start Augmentin  antibiotics. Take 1 tablet by mouth twice daily for 7 days. Start prednisone  20 mg tablets. Take 2 tablets by mouth once daily in the morning for 5 days.  Start Tussionex cough suppressant. Take 5 ml every 12 hours as needed for cough and rest. Caution this medication contains codeine which may cause drowsiness.   Close follow up with PCP if no improvement.   Orders: -     Amoxicillin -Pot Clavulanate; Take 1 tablet by mouth 2 (two) times daily.  Dispense: 14 tablet; Refill: 0 -     Hydrocod Poli-Chlorphe Poli ER; Take 5 mLs by mouth every 12 (twelve) hours as needed for cough.  Dispense: 100 mL; Refill: 0 -     predniSONE ; Take 2 tablets by mouth once daily in the morning for 5 days.  Dispense: 10 tablet; Refill: 0        Comer MARLA Gaskins, NP

## 2023-05-31 NOTE — Assessment & Plan Note (Addendum)
 Given duration of symptoms, coupled with examination and presentation, will treat for presumed bacterial involvement.  Continue omeprazole  40 mg daily.  Start Augmentin  antibiotics. Take 1 tablet by mouth twice daily for 7 days. Start prednisone  20 mg tablets. Take 2 tablets by mouth once daily in the morning for 5 days.  Start Tussionex cough suppressant. Take 5 ml every 12 hours as needed for cough and rest. Caution this medication contains codeine which may cause drowsiness.   Close follow up with PCP if no improvement.

## 2023-05-31 NOTE — Addendum Note (Signed)
 Addended by: Doreene Nest on: 05/31/2023 02:51 PM   Modules accepted: Orders

## 2023-05-31 NOTE — Telephone Encounter (Signed)
 Chief Complaint: persistent cough  Symptoms: non productive cough with coughing spells, fatigue/ mild chest sore with coughing  Frequency: x 2 weeks  Pertinent Negatives: Patient denies bloody sputum, wheezing, fever, nausea, vomiting  Disposition: [] ED /[] Urgent Care (no appt availability in office) / [x] Appointment(In office/virtual)/ []  [facility]/ [] Home Care/ [] Refused Recommended Disposition /[] [facility] [place] [facility]/ []  Follow-up with PCP Additional Notes: Patient states she has treated at home with Muccinex DM and cough syrup and [facility] and told it is a viral URI. Patient states cough has been persistent and has coughing spells. Patient agreeable to office visit today with available office/provider.   Copied from CRM [id number]. Topic: Clinical - Pink Word Triage >> May 31, 2023 10:19 AM Tiffany H wrote: Reason for Triage: Patient called with active dry cough. Patient was interrupted by several fits of coughing while relaying information. Patient is scheduled to have a steroid shot in the morning (06/01/23) and would like to know how that will help her cough. Reason for Disposition  [1] Continuous (nonstop) coughing interferes with work or school AND [2] no improvement using cough treatment per Care Advice  Answer Assessment - Initial Assessment Questions 1. ONSET: When did the cough begin?      December 22nd, 2024.  2. SEVERITY: How bad is the cough today?      Severe, continuous coughing.  3. SPUTUM: Describe the color of your sputum (none, dry cough; clear, white, yellow, green)     Denies sputum coming up with cough.  4. HEMOPTYSIS: Are you coughing up any blood? If so ask: How much? (flecks, streaks, tablespoons, etc.)     Denies blood.  5. DIFFICULTY BREATHING: Are you having difficulty breathing? If Yes, ask: How bad is it? (e.g., mild, moderate, severe)    - MILD: No SOB at rest, mild SOB with walking, speaks normally in  sentences, can lie down, no retractions, pulse < 100.    - MODERATE: SOB at rest, SOB with minimal exertion and prefers to sit, cannot lie down flat, speaks in phrases, mild retractions, audible wheezing, pulse 100-120.    - SEVERE: Very SOB at rest, speaks in single words, struggling to breathe, sitting hunched forward, retractions, pulse > 120      Patient states primarily when coughing; Patient states it is not a SOB feeling but more fatigue.  6. FEVER: Do you have a fever? If Yes, ask: What is your temperature, how was it measured, and when did it start?     Patient states she has not had a fever since December 21 st.  7. CARDIAC HISTORY: Do you have any history of heart disease? (e.g., heart attack, congestive heart failure)      Denies.  8. LUNG HISTORY: Do you have any history of lung disease?  (e.g., pulmonary embolus, asthma, emphysema)     Denies.  9. PE RISK FACTORS: Do you have a history of blood clots? (or: recent major surgery, recent prolonged travel, bedridden)     Denies.  10. OTHER SYMPTOMS: Do you have any other symptoms? (e.g., runny nose, wheezing, chest pain)       Runny nose and sinus congestion in the beginning (patient states she was treating with Muccinex DM and cough syrup which did not help. States she was [facility] which they told her it was a viral URI). chest pain/fatigue when coughing.  11. TRAVEL: Have you traveled out of the country in the last  month? (e.g., travel history, exposures)       Denies.  Protocols used: Cough - Acute Non-Productive-A-AH

## 2023-06-01 ENCOUNTER — Ambulatory Visit: Payer: 59

## 2023-08-29 ENCOUNTER — Ambulatory Visit (INDEPENDENT_AMBULATORY_CARE_PROVIDER_SITE_OTHER): Admitting: Internal Medicine

## 2023-08-29 ENCOUNTER — Encounter: Payer: Self-pay | Admitting: Internal Medicine

## 2023-08-29 VITALS — BP 136/82 | HR 68 | Temp 98.3°F | Ht 62.0 in | Wt 188.0 lb

## 2023-08-29 DIAGNOSIS — I10 Essential (primary) hypertension: Secondary | ICD-10-CM

## 2023-08-29 DIAGNOSIS — Z6830 Body mass index (BMI) 30.0-30.9, adult: Secondary | ICD-10-CM

## 2023-08-29 DIAGNOSIS — R7303 Prediabetes: Secondary | ICD-10-CM

## 2023-08-29 DIAGNOSIS — Z0000 Encounter for general adult medical examination without abnormal findings: Secondary | ICD-10-CM

## 2023-08-29 DIAGNOSIS — E7800 Pure hypercholesterolemia, unspecified: Secondary | ICD-10-CM

## 2023-08-29 DIAGNOSIS — Z0001 Encounter for general adult medical examination with abnormal findings: Secondary | ICD-10-CM

## 2023-08-29 DIAGNOSIS — E559 Vitamin D deficiency, unspecified: Secondary | ICD-10-CM

## 2023-08-29 DIAGNOSIS — M5442 Lumbago with sciatica, left side: Secondary | ICD-10-CM | POA: Diagnosis not present

## 2023-08-29 DIAGNOSIS — R21 Rash and other nonspecific skin eruption: Secondary | ICD-10-CM

## 2023-08-29 DIAGNOSIS — N951 Menopausal and female climacteric states: Secondary | ICD-10-CM

## 2023-08-29 DIAGNOSIS — F419 Anxiety disorder, unspecified: Secondary | ICD-10-CM

## 2023-08-29 DIAGNOSIS — E538 Deficiency of other specified B group vitamins: Secondary | ICD-10-CM

## 2023-08-29 DIAGNOSIS — K219 Gastro-esophageal reflux disease without esophagitis: Secondary | ICD-10-CM | POA: Insufficient documentation

## 2023-08-29 MED ORDER — PHENTERMINE HCL 30 MG PO CAPS: 30.0000 mg | ORAL_CAPSULE | ORAL | 1 refills | Status: DC

## 2023-08-29 MED ORDER — VEOZAH 45 MG PO TABS: 1.0000 | ORAL_TABLET | Freq: Every day | ORAL | 3 refills | Status: DC

## 2023-08-29 MED ORDER — DIAZEPAM 5 MG PO TABS
5.0000 mg | ORAL_TABLET | Freq: Every day | ORAL | 2 refills | Status: AC | PRN
Start: 2023-08-29 — End: ?

## 2023-08-29 MED ORDER — TRULANCE 3 MG PO TABS: ORAL_TABLET | ORAL | 3 refills | Status: DC

## 2023-08-29 MED ORDER — OMEPRAZOLE 40 MG PO CPDR: 40.0000 mg | DELAYED_RELEASE_CAPSULE | Freq: Every day | ORAL | 3 refills | Status: DC

## 2023-08-29 MED ORDER — CYCLOBENZAPRINE HCL 5 MG PO TABS: 5.0000 mg | ORAL_TABLET | Freq: Three times a day (TID) | ORAL | 1 refills | Status: AC | PRN

## 2023-08-29 NOTE — Patient Instructions (Addendum)
 Please have your Shingrix (shingles) shots done at your local pharmacy.  Please avoid shrimp and sesame due to allergies  You are given the handicapped application today  Please take all new medication as prescribed - the flexeril muscle relaxer as needed  Please take all new medication as prescribed - the Veozah for hot flashes, diazepam 5 mg , and phentermine  Ok to use the triamcinolone cream for the arm rash that you have at home  Please continue all other medications as before, and refills have been done if requested.  Please have the pharmacy call with any other refills you may need.  Please continue your efforts at being more active, low cholesterol diet, and weight control.  You are otherwise up to date with prevention measures today.  Please keep your appointments with your specialists as you may have planned  You will be contacted regarding the referral for: Dermatology  Please go to the LAB at the blood drawing area for the tests to be done - in 1 month  You will be contacted by phone if any changes need to be made immediately.  Otherwise, you will receive a letter about your results with an explanation, but please check with MyChart first.  Please make an Appointment to return in 6 months, or sooner if needed

## 2023-08-29 NOTE — Progress Notes (Unsigned)
 Patient ID: Summer Robinson, female   DOB: 11/13/1970, 53 y.o.   MRN: [id number]         Chief Complaint:: wellness exam and Menopause (Weight gain , skin irritation and allergies , back pain )  , low vit d, hld, insomnia menopausal, anxiety, arm rash bilateral, obesity       HPI:  Summer Robinson is a 53 y.o. female here for wellness exam; plans to see gyn soon with mammogram, for shingrix at pharmacy, o/w up to date               Also has worsening insomnia and frequent hot flashes every day for several months.  Denies worsening depressive symptoms, suicidal ideation, or panic; has ongoing anxiety, no longer taking the zoloft or xanax, requests go back to diazepam.  Has mild itchy rash to the post arms bilateral but cannot recall any contact allergen.  Has triam cream at home but did not think to try this.  Unable to lose wt and zepbound too expensive.  Pt continues to have recurring LBP without change in severity, bowel or bladder change, fever, wt loss,  worsening LE pain/numbness/weakness, gait change or falls.  Pt denies chest pain, increased sob or doe, wheezing, orthopnea, PND, increased LE swelling, palpitations, dizziness or syncope.   Pt denies polydipsia, polyuria, or new focal neuro s/s.    Pt denies fever, wt loss, night sweats, loss of appetite, or other constitutional symptoms    Wt Readings from Last 3 Encounters:  08/29/23 188 lb (85.3 kg)  05/31/23 189 lb (85.7 kg)  09/30/22 156 lb 6.4 oz (70.9 kg)   BP Readings from Last 3 Encounters:  08/29/23 136/82  05/31/23 (!) 178/96  09/30/22 134/88   Immunization History  Administered Date(s) Administered   Influenza, Seasonal, Injecte, Preservative Fre 02/18/2023   Influenza,inj,Quad PF,6+ Mos 02/28/2019, 05/27/2020   Influenza-Unspecified 03/07/2018   PFIZER(Purple Top)SARS-COV-2 Vaccination 07/19/2019, 08/14/2019, 03/05/2020   PPD Test 09/26/2020   Pfizer Covid-19 Vaccine Bivalent Booster 81yrs & up 03/16/2021    Pfizer(Comirnaty)Fall Seasonal Vaccine 12 years and older 02/18/2023   Tdap 03/01/2018   Health Maintenance Due  Topic Date Due   Zoster Vaccines- Shingrix (1 of 2) Never done   Cervical Cancer Screening (HPV/Pap Cotest)  10/01/2015   MAMMOGRAM  02/13/2021      Past Medical History:  Diagnosis Date   GERD (gastroesophageal reflux disease)    Hay fever    History reviewed. No pertinent surgical history.  reports that she has never smoked. She has never used smokeless tobacco. She reports current alcohol use. She reports that she does not use drugs. family history includes Cancer in her father; Diabetes in her mother; High Cholesterol in her mother; High blood pressure in her mother; Miscarriages / Stillbirths in her mother. No Known Allergies Current Outpatient Medications on File Prior to Visit  Medication Sig Dispense Refill   erythromycin ophthalmic ointment Place 1 Application into both eyes 3 (three) times daily. 3.5 g 1   ibuprofen (ADVIL) 800 MG tablet Take 1 tablet (800 mg total) by mouth every 8 (eight) hours as needed. 30 tablet 3   lisdexamfetamine (VYVANSE) 30 MG capsule Take 30 mg by mouth daily.     scopolamine (TRANSDERM SCOP, 1.5 MG,) 1 MG/3DAYS Place 1 patch (1.5 mg total) onto the skin every 3 (three) days. 10 patch 2   Serdexmethylphen-Dexmethylphen (AZSTARYS) 26.1-5.2 MG CAPS 1 by mouth per day 30 capsule 0   topiramate (TOPAMAX) 25 MG tablet  Take 1 tablet (25 mg total) by mouth 2 (two) times daily. 60 tablet 11   triamcinolone ointment (KENALOG) 0.1 % Apply 1 application topically 2 (two) times daily. 453.6 g 0   cetirizine (ZYRTEC) 10 MG tablet Take 1 tablet (10 mg total) by mouth daily. 30 tablet 11   No current facility-administered medications on file prior to visit.        ROS:  All others reviewed and negative.  Objective        PE:  BP 136/82 (BP Location: Right Arm, Patient Position: Sitting, Cuff Size: Normal)   Pulse 68   Temp 98.3 F (36.8 C)  (Oral)   Ht 5\' 2"  (1.575 m)   Wt 188 lb (85.3 kg)   LMP 08/22/2018   SpO2 99%   BMI 34.39 kg/m                 Constitutional: Pt appears in NAD               HENT: Head: NCAT.                Right Ear: External ear normal.                 Left Ear: External ear normal.                Eyes: . Pupils are equal, round, and reactive to light. Conjunctivae and EOM are normal               Nose: without d/c or deformity               Neck: Neck supple. Gross normal ROM               Cardiovascular: Normal rate and regular rhythm.                 Pulmonary/Chest: Effort normal and breath sounds without rales or wheezing.                Abd:  Soft, NT, ND, + BS, no organomegaly               Neurological: Pt is alert. At baseline orientation, motor grossly intact               Skin: Skin is warm. Has nontender plaque like erythem rashes nontender but itchy to bilateral post arms from 1 cm above and 6 cm below the antecubitals, , no other new lesions, LE edema - none               Psychiatric: Pt behavior is normal without agitation   Micro: none  Cardiac tracings I have personally interpreted today:  none  Pertinent Radiological findings (summarize): none   Lab Results  Component Value Date   WBC 3.5 (L) 09/30/2022   HGB 13.2 09/30/2022   HCT 40.4 09/30/2022   PLT 206.0 09/30/2022   GLUCOSE 87 09/30/2022   CHOL 243 (H) 09/30/2022   TRIG 60.0 09/30/2022   HDL 91.30 09/30/2022   LDLCALC 140 (H) 09/30/2022   ALT 51 (H) 09/30/2022   AST 53 (H) 09/30/2022   NA 141 09/30/2022   K 4.2 09/30/2022   CL 104 09/30/2022   CREATININE 0.90 09/30/2022   BUN 14 09/30/2022   CO2 29 09/30/2022   TSH 1.19 09/30/2022   HGBA1C 5.6 09/30/2022   MICROALBUR 0.8 09/30/2022   Assessment/Plan:  Summer Robinson is a 53 y.o. Black or African American [  2] female with  has a past medical history of GERD (gastroesophageal reflux disease) and Hay fever.  Encounter for well adult exam with abnormal  findings Age and sex appropriate education and counseling updated with regular exercise and diet Referrals for preventative services - to see GYN with mammogram soon per pt Immunizations addressed - for shingrix Smoking counseling  - none needed Evidence for depression or other mood disorder - none significant Most recent labs reviewed. I have personally reviewed and have noted: 1) the patient's medical and social history 2) The patient's current medications and supplements 3) The patient's height, weight, and BMI have been recorded in the chart   Pre-diabetes Lab Results  Component Value Date   HGBA1C 5.6 09/30/2022   Stable, pt to continue current medical treatment  - diet, wt control   Hot flashes due to menopause Now for veozah 1  qd  Vitamin D deficiency Last vitamin D Lab Results  Component Value Date   VD25OH 34.41 09/30/2022   Low, to start oral replacement   HTN (hypertension) BP Readings from Last 3 Encounters:  08/29/23 136/82  05/31/23 (!) 178/96  09/30/22 134/88   Mild uncontrolled, pt to continue medical treatment  - diet, wt control, declines tx for now   Anxiety For diazepam 5 mg every day prn  Rash Incidental to arms today, suspicious for contact dermatitis, ok for trial triam cr bid prn she has at home, pt requests refer to derm as well in case does not help  HLD (hyperlipidemia) Lab Results  Component Value Date   LDLCALC 140 (H) 09/30/2022   uncontrolled, pt for lower chol diet, declines statin   BMI 30.0-30.9,adult Mild to mod, for phentermine 30 mg every day,  to f/u any worsening symptoms or concerns  Acute left-sided low back pain with left-sided sciatica Acute on chronic,for flexeril 5 tid prn, handicapped parking application signed due to long walk from car into work  Followup: Return in about 6 months (around 02/28/2024).  Oliver Barre, MD 08/31/2023 9:44 PM [facility] [place] [facility] - [facility]  [facility]

## 2023-08-31 ENCOUNTER — Other Ambulatory Visit (HOSPITAL_COMMUNITY): Payer: Self-pay

## 2023-08-31 ENCOUNTER — Telehealth: Payer: Self-pay | Admitting: Pharmacy Technician

## 2023-08-31 ENCOUNTER — Encounter: Payer: Self-pay | Admitting: Internal Medicine

## 2023-08-31 DIAGNOSIS — R21 Rash and other nonspecific skin eruption: Secondary | ICD-10-CM | POA: Insufficient documentation

## 2023-08-31 DIAGNOSIS — E785 Hyperlipidemia, unspecified: Secondary | ICD-10-CM | POA: Insufficient documentation

## 2023-08-31 NOTE — Assessment & Plan Note (Signed)
 Lab Results  Component Value Date   LDLCALC 140 (H) 09/30/2022   uncontrolled, pt for lower chol diet, declines statin

## 2023-08-31 NOTE — Assessment & Plan Note (Signed)
 Age and sex appropriate education and counseling updated with regular exercise and diet Referrals for preventative services - to see GYN with mammogram soon per pt Immunizations addressed - for shingrix Smoking counseling  - none needed Evidence for depression or other mood disorder - none significant Most recent labs reviewed. I have personally reviewed and have noted: 1) the patient's medical and social history 2) The patient's current medications and supplements 3) The patient's height, weight, and BMI have been recorded in the chart

## 2023-08-31 NOTE — Assessment & Plan Note (Signed)
Lab Results  Component Value Date   HGBA1C 5.6 09/30/2022   Stable, pt to continue current medical treatment  - diet, wt control

## 2023-08-31 NOTE — Assessment & Plan Note (Signed)
 Now for veozah 1  qd

## 2023-08-31 NOTE — Assessment & Plan Note (Signed)
 Mild to mod, for phentermine 30 mg every day, to f/u any worsening symptoms or concerns

## 2023-08-31 NOTE — Telephone Encounter (Signed)
 Pharmacy Patient Advocate Encounter  Received notification from [facility] that Prior Authorization for Phentermine HCl 30MG  capsules has been APPROVED from 08/31/23 to 11/29/23. Ran test claim, Copay is $13.87. This test claim was processed through [facility]- copay amounts may vary at other pharmacies due to pharmacy/plan contracts, or as the patient moves through the different stages of their insurance plan.   PA #/Case ID/Reference #: [id number]

## 2023-08-31 NOTE — Telephone Encounter (Signed)
 Clinical questions have been answered and PA submitted. PA currently Pending.

## 2023-08-31 NOTE — Telephone Encounter (Signed)
 Prior Authorization form/request asks a question that requires your assistance. Please see the question below and advise accordingly. The PA will not be submitted until the necessary information is received.

## 2023-08-31 NOTE — Assessment & Plan Note (Signed)
 For diazepam 5 mg every day prn

## 2023-08-31 NOTE — Assessment & Plan Note (Addendum)
 Incidental to arms today, suspicious for contact dermatitis, ok for trial triam cr bid prn she has at home, pt requests refer to derm as well in case does not help

## 2023-08-31 NOTE — Telephone Encounter (Signed)
 Pharmacy Patient Advocate Encounter   Received notification from CoverMyMeds that prior authorization for Veozah 45MG  tablets is required/requested.   Insurance verification completed.   The patient is insured through [facility] .   Per test claim: Summer Robinson is not on her plan's formulary

## 2023-08-31 NOTE — Assessment & Plan Note (Signed)
 BP Readings from Last 3 Encounters:  08/29/23 136/82  05/31/23 (!) 178/96  09/30/22 134/88   Mild uncontrolled, pt to continue medical treatment  - diet, wt control, declines tx for now

## 2023-08-31 NOTE — Assessment & Plan Note (Signed)
Last vitamin D Lab Results  Component Value Date   VD25OH 34.41 09/30/2022   Low, to start oral replacement

## 2023-08-31 NOTE — Assessment & Plan Note (Signed)
 Acute on chronic,for flexeril 5 tid prn, handicapped parking application signed due to long walk from car into work

## 2023-09-20 ENCOUNTER — Encounter: Payer: Self-pay | Admitting: Internal Medicine

## 2023-11-01 ENCOUNTER — Encounter: Payer: Self-pay | Admitting: Internal Medicine

## 2023-11-01 NOTE — Telephone Encounter (Signed)
 Copied from CRM [id number]. Topic: Clinical - Medication Prior Auth >> Nov 01, 2023 12:27 PM Jim Motts C wrote: Reason for CRM: Patient sent a note through MyChart. I will paste it here. "I received a notification that my pre-authorization for Phentermine  will expire soon and it needs to be resubmitted to my insurance company to continue with the medication. I'm requesting the pre-authorization form be sent to my insurance for the continued use of phentermine ."  Patient contact is [phone].

## 2023-11-03 ENCOUNTER — Other Ambulatory Visit (HOSPITAL_COMMUNITY): Payer: Self-pay

## 2023-11-03 ENCOUNTER — Telehealth: Payer: Self-pay

## 2023-11-03 NOTE — Telephone Encounter (Signed)
 Pharmacy Patient Advocate Encounter   Received notification from Patient Advice Request messages that prior authorization for Phentermine  HCl 30MG  capsules is due for renewal.   Insurance verification completed.   The patient is insured through [facility].   Action: PA required; PA started via CoverMyMeds. KEY [id number] . Waiting for clinical questions to populate.

## 2023-11-04 ENCOUNTER — Other Ambulatory Visit (HOSPITAL_COMMUNITY): Payer: Self-pay

## 2023-11-04 NOTE — Telephone Encounter (Signed)
 Pharmacy Patient Advocate Encounter  Received notification from [facility] that Prior Authorization for Phentermine  HCl 30MG  capsules  has been CANCELLED due to: PLAN LIMITATIONS EXCEEDED - Insurance only covers maximum 90 day supply in 365 days. Last filled 08/30/23 for 90 day supply. Patient is able to use discount card (ex: GoodRx) to reduce cost.    PA #/Case ID/Reference #: [id number]

## 2023-11-26 IMAGING — DX DG HAND COMPLETE 3+V*R*
3 series · 3 of 3 positions shown · non-contrast
Comparison: None.

CLINICAL DATA: Right hand pain. Patient reports third and fourth
digit pain. No known injury.

EXAM:
RIGHT HAND - COMPLETE 3+ VIEW

[hand ap]
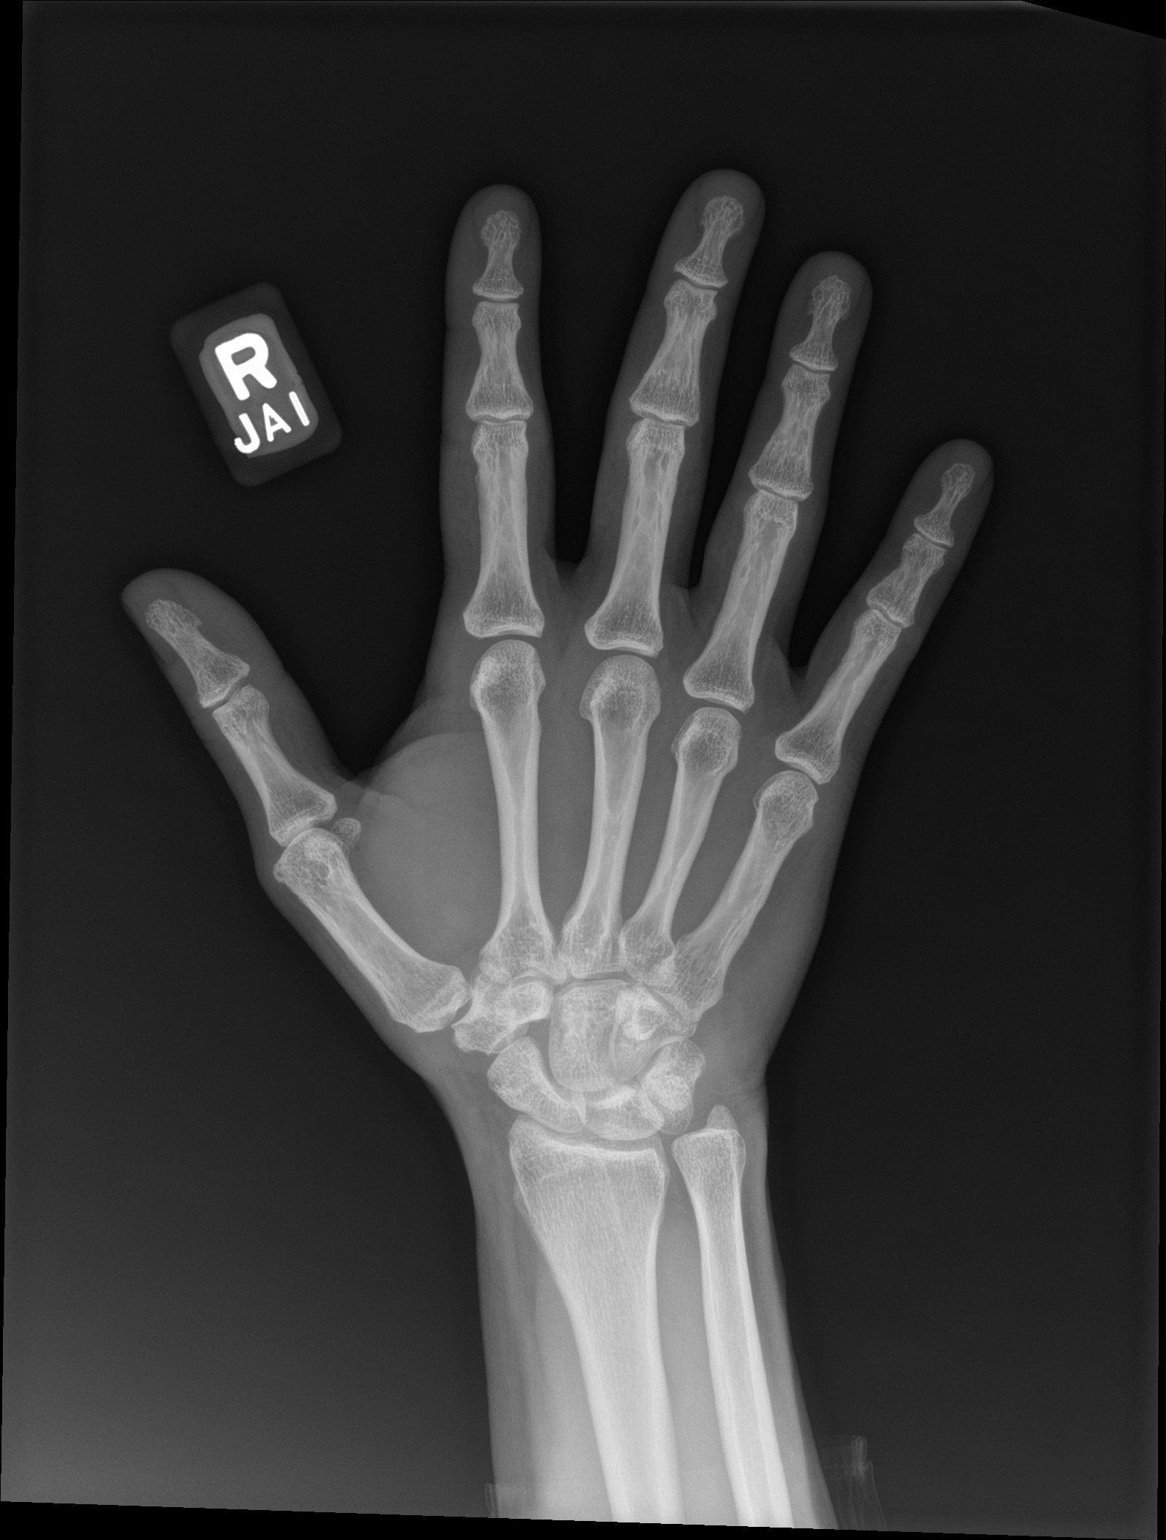

[hand obl]
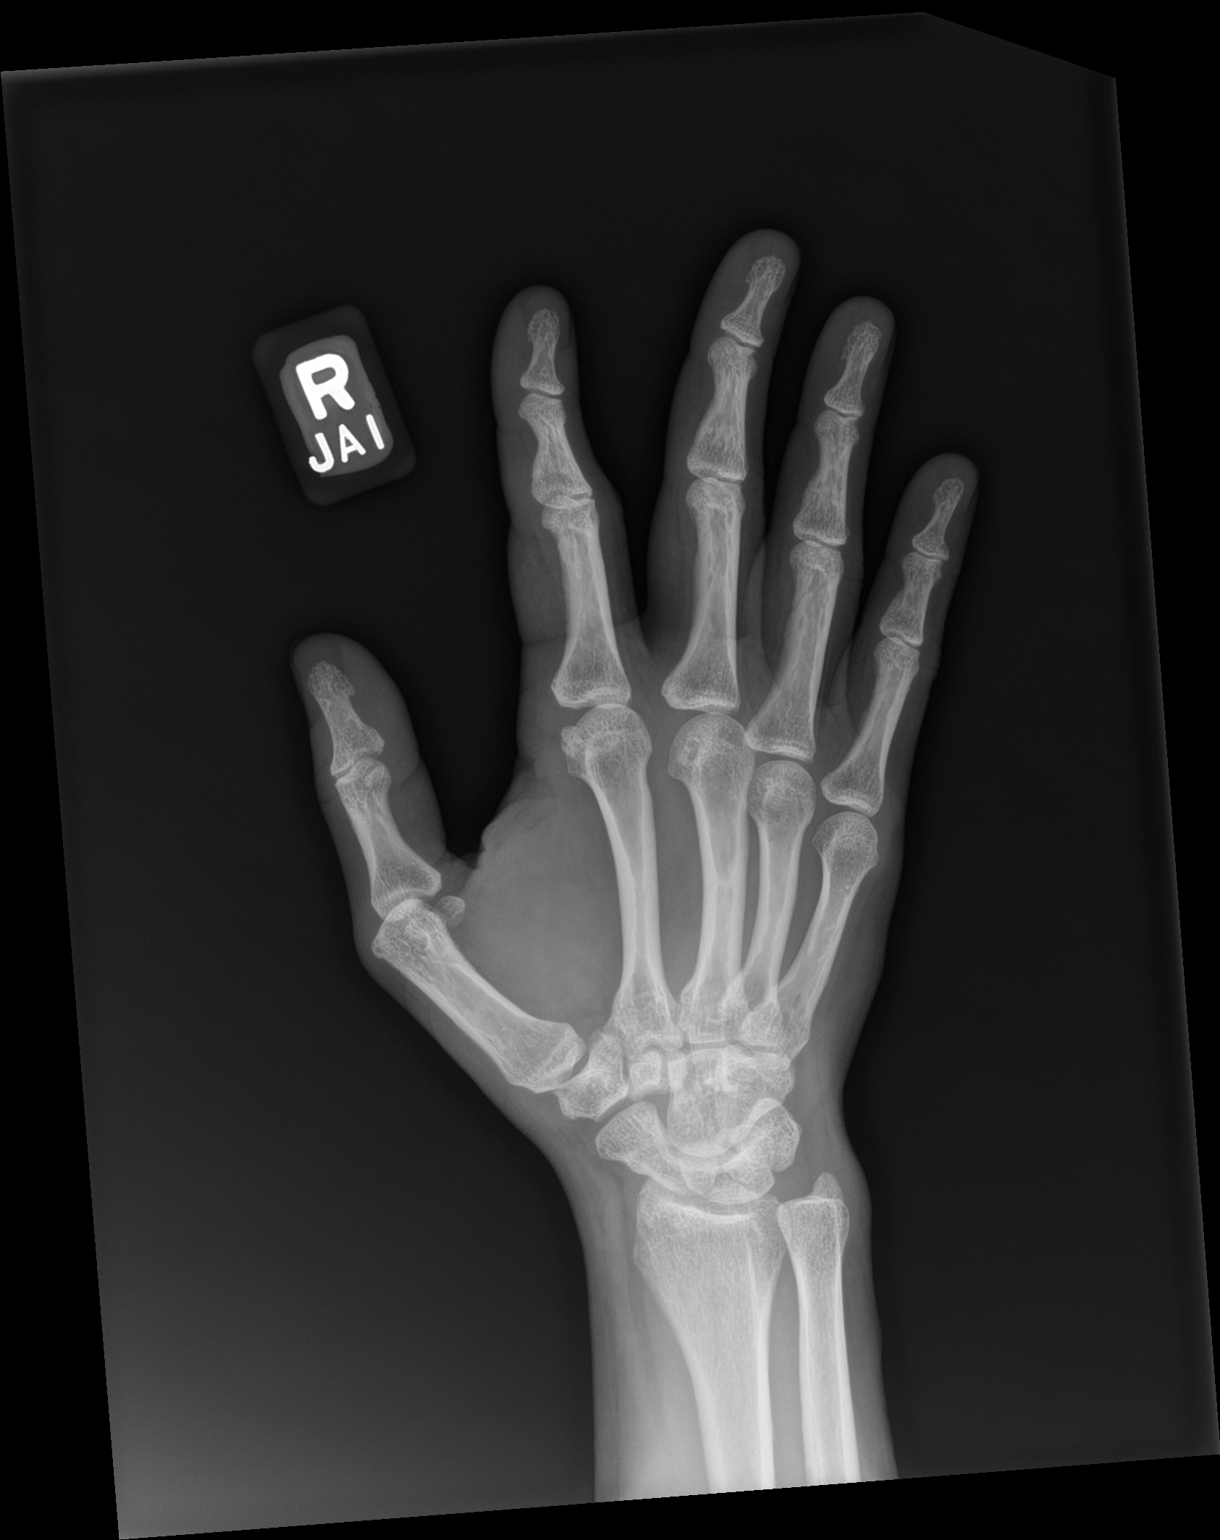

[hand lat]
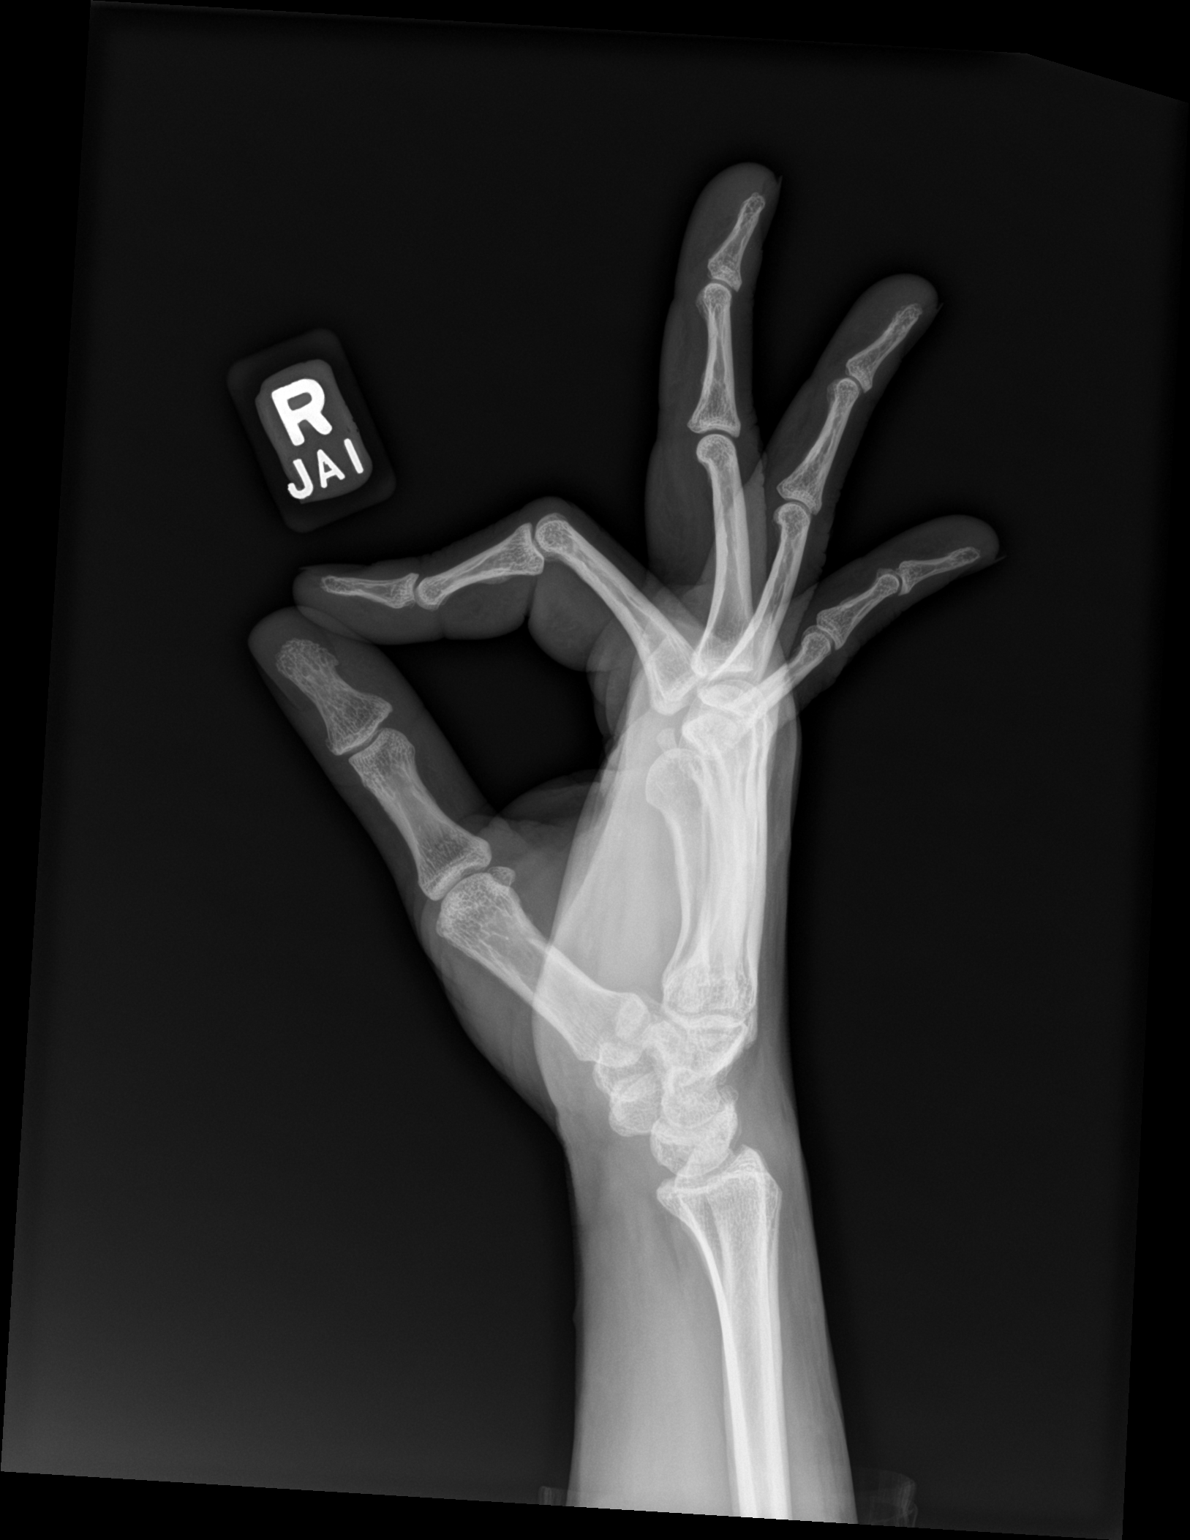

[3 of 3 positions shown; findings below may reference images not displayed]

FINDINGS: There is no evidence of fracture or dislocation. Normal alignment.
Normal joint spaces. Minimal osteophytes of the distal
interphalangeal joints of the third through fifth digits. No erosion
or periostitis. Soft tissues are unremarkable.
IMPRESSION: Minimal osteoarthritis of the distal interphalangeal joints of the
third through fifth digits.

## 2024-02-15 ENCOUNTER — Ambulatory Visit: Payer: Self-pay

## 2024-02-15 NOTE — Telephone Encounter (Signed)
 FYI Only or Action Required?: FYI only for provider.  Patient was last seen in primary care on 08/29/2023 by Norleen Lynwood ORN, MD.  Called Nurse Triage reporting Fever.  Symptoms began yesterday.  Interventions attempted: OTC medications: ibuprofen  and cold meds.  Symptoms are: gradually worsening.  Triage Disposition: See Physician Within 24 Hours  Patient/caregiver understands and will follow disposition?: Yes    Copied from CRM [id number]. Topic: Clinical - Red Word Triage >> Feb 15, 2024  9:25 AM Charlet HERO wrote: Red Word that prompted transfer to Nurse Triage: Patient has chills fever of 102 yesterday and it has broke today runny nose. Dr Norleen Landy Stains Reason for Disposition  Fever present > 3 days (72 hours)  Answer Assessment - Initial Assessment Questions 1. TEMPERATURE: What is the most recent temperature?  How was it measured?      102 2. ONSET: When did the fever start?      yesterday 3. CHILLS: Do you have chills? If yes: How bad are they?  (e.g., none, mild, moderate, severe)     Chills 4. OTHER SYMPTOMS: Do you have any other symptoms besides the fever?  (e.g., abdomen pain, cough, diarrhea, earache, headache, sore throat, urination pain)     Chills, body aches, runny nose, stuffy nose, teeth hurt, sore throat, cough, congestion  5. CAUSE: If there are no symptoms, ask: What do you think is causing the fever?      no 6. CONTACTS: Does anyone else in the family have an infection?     no 7. TREATMENT: What have you done so far to treat this fever? (e.g., OTC fever medicines)     OTC cold and flu med and ibuprofen  8. IMMUNOCOMPROMISE: Do you have any of the following: diabetes, HIV positive, splenectomy, cancer chemotherapy, chronic steroid treatment, transplant patient, etc.?     na 9. PREGNANCY: Is there any chance you are pregnant? When was your last menstrual period?     na 10. TRAVEL: Have you traveled out of the country in the last  month? (e.g., travel history, exposures)       na  Monday - felt bad but no fever Tues pt could not get out of bed & fever  Protocols used: [facility]

## 2024-02-16 ENCOUNTER — Ambulatory Visit: Admitting: Internal Medicine

## 2024-02-16 ENCOUNTER — Encounter: Payer: Self-pay | Admitting: Internal Medicine

## 2024-02-16 VITALS — BP 130/90 | HR 90 | Temp 99.6°F | Ht 62.0 in | Wt 173.6 lb

## 2024-02-16 DIAGNOSIS — U071 COVID-19: Secondary | ICD-10-CM | POA: Diagnosis not present

## 2024-02-16 DIAGNOSIS — R7303 Prediabetes: Secondary | ICD-10-CM | POA: Diagnosis not present

## 2024-02-16 DIAGNOSIS — J309 Allergic rhinitis, unspecified: Secondary | ICD-10-CM | POA: Diagnosis not present

## 2024-02-16 DIAGNOSIS — E559 Vitamin D deficiency, unspecified: Secondary | ICD-10-CM | POA: Diagnosis not present

## 2024-02-16 DIAGNOSIS — I10 Essential (primary) hypertension: Secondary | ICD-10-CM

## 2024-02-16 LAB — POCT INFLUENZA A/B
Influenza A, POC: NEGATIVE
Influenza B, POC: NEGATIVE

## 2024-02-16 LAB — POC COVID19 BINAXNOW: SARS Coronavirus 2 Ag: POSITIVE — AB

## 2024-02-16 MED ORDER — NIRMATRELVIR/RITONAVIR (PAXLOVID)TABLET: 3.0000 | ORAL_TABLET | Freq: Two times a day (BID) | ORAL | 0 refills | Status: AC

## 2024-02-16 MED ORDER — HYDROCODONE BIT-HOMATROP MBR 5-1.5 MG/5ML PO SOLN: 5.0000 mL | Freq: Four times a day (QID) | ORAL | 0 refills | Status: AC | PRN

## 2024-02-16 NOTE — Patient Instructions (Addendum)
 Please take all new medication as prescribed - the paxlovid  antibiotic, and cough medicine as needed  You should not work today, but you should be ok to resume work on Monday  Please continue all other medications as before, and refills have been done if requested.  Please have the pharmacy call with any other refills you may need.  Please keep your appointments with your specialists as you may have planned

## 2024-02-16 NOTE — Assessment & Plan Note (Signed)
 Lab Results  Component Value Date   HGBA1C 5.6 09/30/2022   Stable, pt to continue current medical treatment  - diet wt control

## 2024-02-16 NOTE — Addendum Note (Signed)
 Addended byBETHA LUCETTA CLEATRICE LELON on: 02/16/2024 10:49 AM   Modules accepted: Orders

## 2024-02-16 NOTE — Assessment & Plan Note (Signed)
 Mild to mod, for antibx course paxlovid , cough med prn no work today or tomorrow,  to f/u any worsening symptoms or concerns

## 2024-02-16 NOTE — Assessment & Plan Note (Signed)
Last vitamin D Lab Results  Component Value Date   VD25OH 34.41 09/30/2022   Low, to start oral replacement

## 2024-02-16 NOTE — Assessment & Plan Note (Signed)
 Also for otc zyrtec  10 mg every day prn

## 2024-02-16 NOTE — Assessment & Plan Note (Signed)
 BP Readings from Last 3 Encounters:  02/16/24 (!) 130/90  08/29/23 136/82  05/31/23 (!) 178/96   Mild uncontrolled, likely reactive, pt to continue medical treatment  - diet, wt control, declines other change

## 2024-02-16 NOTE — Progress Notes (Addendum)
 Patient ID: Summer Robinson, female   DOB: 22-Jun-1970, 53 y.o.   MRN: [id number]        Chief Complaint: follow up covid infection, low vit d, preDM, allergies       HPI:  Summer Robinson is a 53 y.o. female  Here with 3 days acute onset fever, facial pain, pressure, headache, general weakness and malaise, and clearish yelllow d/c, with mild ST and scant cough, but pt denies chest pain, wheezing, increased sob or doe, orthopnea, PND, increased LE swelling, palpitations, dizziness or syncope.  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  Does have several wks ongoing nasal allergy  symptoms with clearish congestion, itch and sneezing, without fever, pain, ST, cough, swelling or wheezing.  Pt denies polydipsia, polyuria, or new focal neuro s/s.         Wt Readings from Last 3 Encounters:  02/16/24 173 lb 9.6 oz (78.7 kg)  08/29/23 188 lb (85.3 kg)  05/31/23 189 lb (85.7 kg)   BP Readings from Last 3 Encounters:  02/16/24 (!) 130/90  08/29/23 136/82  05/31/23 (!) 178/96         Past Medical History:  Diagnosis Date   Anxiety    GERD (gastroesophageal reflux disease)    Hay fever    History reviewed. No pertinent surgical history.  reports that she has never smoked. She has never used smokeless tobacco. She reports current alcohol use. She reports that she does not use drugs. family history includes ADD / [name] in her sister; Cancer in her father; Diabetes in her mother; High Cholesterol in her mother; High blood pressure in her mother; Miscarriages / Stillbirths in her mother. No Known Allergies Current Outpatient Medications on File Prior to Visit  Medication Sig Dispense Refill   cetirizine  (ZYRTEC ) 10 MG tablet Take 1 tablet (10 mg total) by mouth daily. 30 tablet 11   cyclobenzaprine  (FLEXERIL ) 5 MG tablet Take 1 tablet (5 mg total) by mouth 3 (three) times daily as needed. 40 tablet 1   diazepam  (VALIUM ) 5 MG tablet Take 1 tablet (5 mg total) by mouth daily as needed for  anxiety. 30 tablet 2   erythromycin  ophthalmic ointment Place 1 Application into both eyes 3 (three) times daily. 3.5 g 1   Fezolinetant  (VEOZAH ) 45 MG TABS Take 1 tablet (45 mg total) by mouth daily. 90 tablet 3   ibuprofen  (ADVIL ) 800 MG tablet Take 1 tablet (800 mg total) by mouth every 8 (eight) hours as needed. 30 tablet 3   lisdexamfetamine (VYVANSE ) 30 MG capsule Take 30 mg by mouth daily.     omeprazole  (PRILOSEC) 40 MG capsule Take 1 capsule (40 mg total) by mouth daily. 90 capsule 3   phentermine  30 MG capsule Take 1 capsule (30 mg total) by mouth every morning. 90 capsule 1   Plecanatide  (TRULANCE ) 3 MG TABS 1 tab by mouth once daily as needed 90 tablet 3   scopolamine  (TRANSDERM SCOP , 1.5 MG,) 1 MG/3DAYS Place 1 patch (1.5 mg total) onto the skin every 3 (three) days. 10 patch 2   Serdexmethylphen-Dexmethylphen (AZSTARYS ) 26.1-5.2 MG CAPS 1 by mouth per day 30 capsule 0   topiramate  (TOPAMAX ) 25 MG tablet Take 1 tablet (25 mg total) by mouth 2 (two) times daily. 60 tablet 11   triamcinolone  ointment (KENALOG ) 0.1 % Apply 1 application topically 2 (two) times daily. 453.6 g 0   No current facility-administered medications on file prior to visit.  ROS:  All others reviewed and negative.  Objective        PE:  BP (!) 130/90   Pulse 90   Temp 99.6 F (37.6 C)   Ht 5' 2 (1.575 m)   Wt 173 lb 9.6 oz (78.7 kg)   LMP 08/22/2018   SpO2 99%   BMI 31.75 kg/m                 Constitutional: Pt appears mild ill               HENT: Head: NCAT.                Right Ear: External ear normal.                 Left Ear: External ear normal. Bilat tm's with mild erythema.  Max sinus areas non tender.  Pharynx with mild erythema, no exudate                Eyes: . Pupils are equal, round, and reactive to light. Conjunctivae and EOM are normal               Nose: without d/c or deformity               Neck: Neck supple. Gross normal ROM               Cardiovascular: Normal rate and  regular rhythm.                 Pulmonary/Chest: Effort normal and breath sounds without rales or wheezing.                              Neurological: Pt is alert. At baseline orientation, motor grossly intact               Skin: Skin is warm. No rashes, no other new lesions, LE edema - none               Psychiatric: Pt behavior is normal without agitation   Micro: none  Cardiac tracings I have personally interpreted today:  none  Pertinent Radiological findings (summarize): none   Lab Results  Component Value Date   WBC 3.5 (L) 09/30/2022   HGB 13.2 09/30/2022   HCT 40.4 09/30/2022   PLT 206.0 09/30/2022   GLUCOSE 87 09/30/2022   CHOL 243 (H) 09/30/2022   TRIG 60.0 09/30/2022   HDL 91.30 09/30/2022   LDLCALC 140 (H) 09/30/2022   ALT 51 (H) 09/30/2022   AST 53 (H) 09/30/2022   NA 141 09/30/2022   K 4.2 09/30/2022   CL 104 09/30/2022   CREATININE 0.90 09/30/2022   BUN 14 09/30/2022   CO2 29 09/30/2022   TSH 1.19 09/30/2022   HGBA1C 5.6 09/30/2022   POCT - Covid positive, Flu - neg  Assessment/Plan:  Summer Robinson is a 53 y.o. Black or African American [2] female with  has a past medical history of Anxiety, GERD (gastroesophageal reflux disease), and Hay fever.  Vitamin D  deficiency Last vitamin D  Lab Results  Component Value Date   VD25OH 34.41 09/30/2022   Low, to start oral replacement   Pre-diabetes Lab Results  Component Value Date   HGBA1C 5.6 09/30/2022   Stable, pt to continue current medical treatment  - diet wt control   HTN (hypertension) BP Readings from Last 3 Encounters:  02/16/24 (!) 130/90  08/29/23 136/82  05/31/23 (!) 178/96   Mild uncontrolled, likely reactive, pt to continue medical treatment  - diet, wt control, declines other change   COVID-19 virus infection Mild to mod, for antibx course paxlovid , cough med prn no work today or tomorrow,  to f/u any worsening symptoms or concerns   Allergic rhinitis Also for otc zyrtec   10 mg every day prn Followup: Return if symptoms worsen or fail to improve.  Lynwood Rush, MD 02/16/2024 10:44 AM [facility] - [facility]

## 2024-02-28 ENCOUNTER — Ambulatory Visit: Admitting: Internal Medicine

## 2024-03-05 ENCOUNTER — Encounter: Payer: Self-pay | Admitting: Internal Medicine

## 2024-03-05 NOTE — Telephone Encounter (Signed)
 Called and rescheduled Pt appt.

## 2024-03-05 NOTE — Telephone Encounter (Signed)
 Ok to contact pt -   Ok to change the oct 2025 appt to Jan 2026 (or soon after as she wants)    thanks

## 2024-03-07 ENCOUNTER — Ambulatory Visit: Admitting: Internal Medicine

## 2024-03-12 ENCOUNTER — Ambulatory Visit: Admitting: Internal Medicine

## 2024-03-15 ENCOUNTER — Encounter: Payer: Self-pay | Admitting: Internal Medicine

## 2024-03-15 ENCOUNTER — Other Ambulatory Visit: Payer: Self-pay

## 2024-03-15 MED ORDER — IBUPROFEN 800 MG PO TABS: 800.0000 mg | ORAL_TABLET | Freq: Three times a day (TID) | ORAL | 3 refills | Status: AC | PRN

## 2024-04-12 ENCOUNTER — Encounter: Payer: Self-pay | Admitting: Internal Medicine

## 2024-04-13 ENCOUNTER — Other Ambulatory Visit (INDEPENDENT_AMBULATORY_CARE_PROVIDER_SITE_OTHER)

## 2024-04-13 ENCOUNTER — Ambulatory Visit: Payer: Self-pay | Admitting: Internal Medicine

## 2024-04-13 DIAGNOSIS — E559 Vitamin D deficiency, unspecified: Secondary | ICD-10-CM | POA: Diagnosis not present

## 2024-04-13 DIAGNOSIS — I10 Essential (primary) hypertension: Secondary | ICD-10-CM

## 2024-04-13 DIAGNOSIS — E538 Deficiency of other specified B group vitamins: Secondary | ICD-10-CM

## 2024-04-13 DIAGNOSIS — R7303 Prediabetes: Secondary | ICD-10-CM

## 2024-04-13 LAB — BASIC METABOLIC PANEL WITH GFR
BUN: 22 mg/dL (ref 6–23)
CO2: 22 meq/L (ref 19–32)
Calcium: 10.2 mg/dL (ref 8.4–10.5)
Chloride: 103 meq/L (ref 96–112)
Creatinine, Ser: 1.12 mg/dL (ref 0.40–1.20)
GFR: 56.2 mL/min — ABNORMAL LOW (ref >60.00)
Glucose, Bld: 92 mg/dL (ref 70–99)
Potassium: 3.3 meq/L — ABNORMAL LOW (ref 3.5–5.1)
Sodium: 136 meq/L (ref 135–145)

## 2024-04-13 LAB — URINALYSIS, ROUTINE W REFLEX MICROSCOPIC
Hgb urine dipstick: NEGATIVE
Ketones, ur: NEGATIVE
Leukocytes,Ua: NEGATIVE
Nitrite: NEGATIVE
Specific Gravity, Urine: >=1.03 — AB (ref 1.000–1.030)
Urine Glucose: NEGATIVE
Urobilinogen, UA: 0.2 (ref 0.0–1.0)
pH: 5.5 (ref 5.0–8.0)

## 2024-04-13 LAB — LIPID PANEL
Cholesterol: 174 mg/dL (ref 0–200)
HDL: 59.4 mg/dL (ref >39.00)
LDL Cholesterol: 91 mg/dL (ref 0–99)
NonHDL: 114.41
Total CHOL/HDL Ratio: 3
Triglycerides: 115 mg/dL (ref 0.0–149.0)
VLDL: 23 mg/dL (ref 0.0–40.0)

## 2024-04-13 LAB — CBC WITH DIFFERENTIAL/PLATELET
Basophils Absolute: 0 K/uL (ref 0.0–0.1)
Basophils Relative: 0.8 % (ref 0.0–3.0)
Eosinophils Absolute: 0 K/uL (ref 0.0–0.7)
Eosinophils Relative: 0.8 % (ref 0.0–5.0)
HCT: 40.2 % (ref 36.0–46.0)
Hemoglobin: 13.1 g/dL (ref 12.0–15.0)
Lymphocytes Relative: 37.5 % (ref 12.0–46.0)
Lymphs Abs: 1.7 K/uL (ref 0.7–4.0)
MCHC: 32.6 g/dL (ref 30.0–36.0)
MCV: 87.1 fl (ref 78.0–100.0)
Monocytes Absolute: 0.5 K/uL (ref 0.1–1.0)
Monocytes Relative: 11.7 % (ref 3.0–12.0)
Neutro Abs: 2.3 K/uL (ref 1.4–7.7)
Neutrophils Relative %: 49.2 % (ref 43.0–77.0)
Platelets: 235 K/uL (ref 150.0–400.0)
RBC: 4.61 Mil/uL (ref 3.87–5.11)
RDW: 15 % (ref 11.5–15.5)
WBC: 4.6 K/uL (ref 4.0–10.5)

## 2024-04-13 LAB — MICROALBUMIN / CREATININE URINE RATIO
Creatinine,U: 539.1 mg/dL
Microalb Creat Ratio: 7.4 mg/g (ref 0.0–30.0)
Microalb, Ur: 4 mg/dL — ABNORMAL HIGH (ref 0.0–1.9)

## 2024-04-13 LAB — HEPATIC FUNCTION PANEL
ALT: 15 U/L (ref 0–35)
AST: 22 U/L (ref 0–37)
Albumin: 4.2 g/dL (ref 3.5–5.2)
Alkaline Phosphatase: 79 U/L (ref 39–117)
Bilirubin, Direct: 0.1 mg/dL (ref 0.0–0.3)
Total Bilirubin: 0.6 mg/dL (ref 0.2–1.2)
Total Protein: 8.2 g/dL (ref 6.0–8.3)

## 2024-04-13 LAB — TSH: TSH: 2.2 u[IU]/mL (ref 0.35–5.50)

## 2024-04-13 LAB — VITAMIN D 25 HYDROXY (VIT D DEFICIENCY, FRACTURES): VITD: 42.98 ng/mL (ref 30.00–100.00)

## 2024-04-13 LAB — HEMOGLOBIN A1C: Hgb A1c MFr Bld: 5.8 % (ref 4.6–6.5)

## 2024-04-13 LAB — VITAMIN B12: Vitamin B-12: 361 pg/mL (ref 211–911)

## 2024-04-23 LAB — HM COLONOSCOPY

## 2024-04-26 ENCOUNTER — Ambulatory Visit: Admitting: Dermatology

## 2024-04-26 ENCOUNTER — Encounter: Payer: Self-pay | Admitting: Dermatology

## 2024-04-26 DIAGNOSIS — W908XXA Exposure to other nonionizing radiation, initial encounter: Secondary | ICD-10-CM

## 2024-04-26 DIAGNOSIS — Z1283 Encounter for screening for malignant neoplasm of skin: Secondary | ICD-10-CM

## 2024-04-26 DIAGNOSIS — L988 Other specified disorders of the skin and subcutaneous tissue: Secondary | ICD-10-CM

## 2024-04-26 DIAGNOSIS — D229 Melanocytic nevi, unspecified: Secondary | ICD-10-CM

## 2024-04-26 DIAGNOSIS — L578 Other skin changes due to chronic exposure to nonionizing radiation: Secondary | ICD-10-CM | POA: Diagnosis not present

## 2024-04-26 DIAGNOSIS — D225 Melanocytic nevi of trunk: Secondary | ICD-10-CM | POA: Diagnosis not present

## 2024-04-26 DIAGNOSIS — D1801 Hemangioma of skin and subcutaneous tissue: Secondary | ICD-10-CM

## 2024-04-26 DIAGNOSIS — L814 Other melanin hyperpigmentation: Secondary | ICD-10-CM

## 2024-04-26 DIAGNOSIS — L821 Other seborrheic keratosis: Secondary | ICD-10-CM

## 2024-04-26 DIAGNOSIS — D489 Neoplasm of uncertain behavior, unspecified: Secondary | ICD-10-CM | POA: Diagnosis not present

## 2024-04-26 NOTE — Progress Notes (Signed)
   New Patient Visit  Patient (and/or pt guardian) consented to the use of AI-assisted tools for note generation.   Subjective  Summer Robinson is a 53 y.o. female who presents for the following:  Total Body Skin Exam (TBSE)  Patient present today for new patient visit for TBSE. The patient denies  has spots, moles and lesions to be evaluated, some may be new or changing and the patient may have concern these could be cancer. Patient has previously been treated by a dermatologist but states that it was many years ago Patient reports she does not have hx of bx.  Patient denies family history of skin cancers.  Patient reports throughout her lifetime has had moderate sun exposure. Currently, patient reports if she has excessive sun exposure, she does apply sunscreen and/or wears protective coverings.  The following portions of the chart were reviewed this encounter and updated as appropriate: medications, allergies, medical history  Review of Systems:  No other skin or systemic complaints except as noted in HPI or Assessment and Plan.  Objective  Well appearing patient in no apparent distress; mood and affect are within normal limits.  A full examination was performed including scalp, head, eyes, ears, nose, lips, neck, chest, axillae, abdomen, back, buttocks, bilateral upper extremities, bilateral lower extremities, hands, feet, fingers, toes, fingernails, and toenails. All findings within normal limits unless otherwise noted below.     Relevant exam findings are noted in the Assessment and Plan.  Left Buttock 9 mm irregular brown macule  Right Lateral Plantar Surface Benign appearing brown macule on plantar surface of right foot   Assessment & Plan   LENTIGINES, SEBORRHEIC KERATOSES, HEMANGIOMAS - Benign normal skin lesions - Benign-appearing - Call for any changes  MELANOCYTIC NEVI - Tan-brown and/or pink-flesh-colored symmetric macules and papules - Benign appearing on exam  today - Observation - Call clinic for new or changing moles - Recommend daily use of broad spectrum spf 30+ sunscreen to sun-exposed areas.   ACTINIC DAMAGE - Chronic condition, secondary to cumulative UV/sun exposure - diffuse scaly erythematous macules with underlying dyspigmentation - Recommend daily broad spectrum sunscreen SPF 30+ to sun-exposed areas, reapply every 2 hours as needed.  - Staying in the shade or wearing long sleeves, sun glasses (UVA+UVB protection) and wide brim hats (4-inch brim around the entire circumference of the hat) are also recommended for sun protection.  - Call for new or changing lesions.  SKIN CANCER SCREENING PERFORMED TODAY  NEOPLASM OF UNCERTAIN BEHAVIOR Left Buttock Epidermal / dermal shaving  Lesion diameter (cm):  0.9 Informed consent: discussed and consent obtained   Timeout: patient name, date of birth, surgical site, and procedure verified   Procedure prep:  Patient was prepped and draped in usual sterile fashion Prep type:  Isopropyl alcohol Instrument used: DermaBlade   Hemostasis achieved with: aluminum chloride   Outcome: patient tolerated procedure well   Post-procedure details: sterile dressing applied and wound care instructions given   Dressing type: bandage    Specimen A - Surgical pathology Differential Diagnosis: r/o DN  Check Margins: No SKIN MACULE Right Lateral Plantar Surface  Return in about 1 year (around 04/26/2025) for FBSE F/U.  I, Lyle Cords, as acting as a neurosurgeon for [employer], DO .   Documentation: I have reviewed the above documentation for accuracy and completeness, and I agree with the above.  Delon Lenis, DO

## 2024-04-26 NOTE — Patient Instructions (Addendum)

## 2024-04-27 LAB — SURGICAL PATHOLOGY

## 2024-04-30 ENCOUNTER — Ambulatory Visit: Payer: Self-pay | Admitting: Dermatology

## 2024-05-08 ENCOUNTER — Encounter: Payer: Self-pay | Admitting: Internal Medicine

## 2024-05-09 ENCOUNTER — Other Ambulatory Visit: Payer: Self-pay | Admitting: Internal Medicine

## 2024-06-05 ENCOUNTER — Ambulatory Visit: Admitting: Internal Medicine

## 2024-06-06 ENCOUNTER — Encounter: Payer: Self-pay | Admitting: Internal Medicine

## 2024-06-06 ENCOUNTER — Ambulatory Visit: Payer: Self-pay | Admitting: Internal Medicine

## 2024-06-06 ENCOUNTER — Ambulatory Visit: Admitting: Internal Medicine

## 2024-06-06 VITALS — BP 124/80 | HR 80 | Temp 99.1°F | Ht 62.0 in | Wt 176.0 lb

## 2024-06-06 DIAGNOSIS — E7800 Pure hypercholesterolemia, unspecified: Secondary | ICD-10-CM | POA: Diagnosis not present

## 2024-06-06 DIAGNOSIS — E538 Deficiency of other specified B group vitamins: Secondary | ICD-10-CM

## 2024-06-06 DIAGNOSIS — N289 Disorder of kidney and ureter, unspecified: Secondary | ICD-10-CM | POA: Diagnosis not present

## 2024-06-06 DIAGNOSIS — Z0001 Encounter for general adult medical examination with abnormal findings: Secondary | ICD-10-CM | POA: Diagnosis not present

## 2024-06-06 DIAGNOSIS — Z0000 Encounter for general adult medical examination without abnormal findings: Secondary | ICD-10-CM

## 2024-06-06 DIAGNOSIS — I10 Essential (primary) hypertension: Secondary | ICD-10-CM

## 2024-06-06 DIAGNOSIS — R7303 Prediabetes: Secondary | ICD-10-CM

## 2024-06-06 DIAGNOSIS — E559 Vitamin D deficiency, unspecified: Secondary | ICD-10-CM

## 2024-06-06 DIAGNOSIS — Z23 Encounter for immunization: Secondary | ICD-10-CM

## 2024-06-06 DIAGNOSIS — E876 Hypokalemia: Secondary | ICD-10-CM

## 2024-06-06 LAB — BASIC METABOLIC PANEL WITH GFR
BUN: 12 mg/dL (ref 6–23)
CO2: 28 meq/L (ref 19–32)
Calcium: 9.8 mg/dL (ref 8.4–10.5)
Chloride: 102 meq/L (ref 96–112)
Creatinine, Ser: 0.88 mg/dL (ref 0.40–1.20)
GFR: 74.99 mL/min
Glucose, Bld: 100 mg/dL — ABNORMAL HIGH (ref 70–99)
Potassium: 3.5 meq/L (ref 3.5–5.1)
Sodium: 138 meq/L (ref 135–145)

## 2024-06-06 MED ORDER — LISDEXAMFETAMINE DIMESYLATE 30 MG PO CAPS: 30.0000 mg | ORAL_CAPSULE | Freq: Every day | ORAL | 0 refills | Status: AC

## 2024-06-06 MED ORDER — DIAZEPAM 5 MG PO TABS: 5.0000 mg | ORAL_TABLET | Freq: Every day | ORAL | 2 refills | Status: AC | PRN

## 2024-06-06 MED ORDER — OMEPRAZOLE 40 MG PO CPDR: 40.0000 mg | DELAYED_RELEASE_CAPSULE | Freq: Every day | ORAL | 3 refills | Status: AC

## 2024-06-06 MED ORDER — TRULANCE 3 MG PO TABS: ORAL_TABLET | ORAL | 3 refills | Status: AC

## 2024-06-06 NOTE — Progress Notes (Signed)
 Patient ID: Summer Robinson, female   DOB: 1970-07-16, 54 y.o.   MRN: [id number]         Chief Complaint:: wellness exam and low vit d, preDM, htn, hld       HPI:  Summer Robinson is a 54 y.o. female here for wellness exam; sees Gyn and mammogram yearly; declines hep B vax for now, due for prevnar 20, o/w up to date               Also Pt denies chest pain, increased sob or doe, wheezing, orthopnea, PND, increased LE swelling, palpitations, dizziness or syncope.   Pt denies polydipsia, polyuria, or new focal neuro s/s.    Pt denies fever, wt loss, night sweats, loss of appetite, or other constitutional symptoms  Previsit lab shows mild worsening renal fxn, mild low K, low B12   Wt Readings from Last 3 Encounters:  06/06/24 176 lb (79.8 kg)  02/16/24 173 lb 9.6 oz (78.7 kg)  08/29/23 188 lb (85.3 kg)   BP Readings from Last 3 Encounters:  06/06/24 124/80  02/16/24 (!) 130/90  08/29/23 136/82   Immunization History  Administered Date(s) Administered   Influenza, Mdck, Trivalent,PF 6+ MOS(egg free) 04/02/2024   Influenza, Seasonal, Injecte, Preservative Fre 02/18/2023   Influenza,inj,Quad PF,6+ Mos 02/28/2019, 05/27/2020   Influenza-Unspecified 03/07/2018   PFIZER(Purple Top)SARS-COV-2 Vaccination 07/19/2019, 08/14/2019, 03/05/2020   PNEUMOCOCCAL CONJUGATE-20 06/06/2024   PPD Test 09/26/2020   Pfizer Covid-19 Vaccine Bivalent Booster 19yrs & up 03/16/2021   Tdap 03/01/2018   Zoster Recombinant(Shingrix) 04/02/2024, 06/01/2024   Health Maintenance Due  Topic Date Due   Hepatitis B Vaccines 19-59 Average Risk (1 of 3 - 19+ 3-dose series) Never done   Cervical Cancer Screening (HPV/Pap Cotest)  10/01/2015   Mammogram  02/13/2021      Past Medical History:  Diagnosis Date   Anxiety    GERD (gastroesophageal reflux disease)    Hay fever    History reviewed. No pertinent surgical history.  reports that she has never smoked. She has never used smokeless tobacco. She reports  current alcohol use. She reports that she does not use drugs. family history includes ADD / [name] in her sister; Cancer in her father; Diabetes in her mother; High Cholesterol in her mother; High blood pressure in her mother; Miscarriages / Stillbirths in her mother. Allergies[1] Medications Ordered Prior to Encounter[2]      ROS:  All others reviewed and negative.  Objective        PE:  BP 124/80 (BP Location: Right Arm, Patient Position: Sitting, Cuff Size: Normal)   Pulse 80   Temp 99.1 F (37.3 C) (Oral)   Ht 5' 2 (1.575 m)   Wt 176 lb (79.8 kg)   LMP 08/22/2018   SpO2 98%   BMI 32.19 kg/m                 Constitutional: Pt appears in NAD               HENT: Head: NCAT.                Right Ear: External ear normal.                 Left Ear: External ear normal.                Eyes: . Pupils are equal, round, and reactive to light. Conjunctivae and EOM are normal  Nose: without d/c or deformity               Neck: Neck supple. Gross normal ROM               Cardiovascular: Normal rate and regular rhythm.                 Pulmonary/Chest: Effort normal and breath sounds without rales or wheezing.                Abd:  Soft, NT, ND, + BS, no organomegaly               Neurological: Pt is alert. At baseline orientation, motor grossly intact               Skin: Skin is warm. No rashes, no other new lesions, LE edema - none               Psychiatric: Pt behavior is normal without agitation   Micro: none  Cardiac tracings I have personally interpreted today:  none  Pertinent Radiological findings (summarize): none   Lab Results  Component Value Date   WBC 4.6 04/13/2024   HGB 13.1 04/13/2024   HCT 40.2 04/13/2024   PLT 235.0 04/13/2024   GLUCOSE 100 (H) 06/06/2024   CHOL 174 04/13/2024   TRIG 115.0 04/13/2024   HDL 59.40 04/13/2024   LDLCALC 91 04/13/2024   ALT 15 04/13/2024   AST 22 04/13/2024   NA 138 06/06/2024   K 3.5 06/06/2024   CL 102 06/06/2024    CREATININE 0.88 06/06/2024   BUN 12 06/06/2024   CO2 28 06/06/2024   TSH 2.20 04/13/2024   HGBA1C 5.8 04/13/2024   MICROALBUR 4.0 (H) 04/13/2024   Assessment/Plan:  Summer Robinson is a 54 y.o. Black or African American [2] female with  has a past medical history of Anxiety, GERD (gastroesophageal reflux disease), and Hay fever.  Preventative health care Age and sex appropriate education and counseling updated with regular exercise and diet Referrals for preventative services - none needed Immunizations addressed - for prevnar 20, declines hep b vax for now Smoking counseling  - none needed Evidence for depression or other mood disorder - none significant Most recent labs reviewed. I have personally reviewed and have noted: 1) the patient's medical and social history 2) The patient's current medications and supplements 3) The patient's height, weight, and BMI have been recorded in the chart   Vitamin D  deficiency Last vitamin D  Lab Results  Component Value Date   VD25OH 42.98 04/13/2024   Stable, cont oral replacement   Pre-diabetes Lab Results  Component Value Date   HGBA1C 5.8 04/13/2024   Stable, pt to continue current medical treatment  - diet, wt control   HTN (hypertension) BP Readings from Last 3 Encounters:  06/06/24 124/80  02/16/24 (!) 130/90  08/29/23 136/82   Stable, pt to continue medical treatment  - diet, wt control   HLD (hyperlipidemia) Lab Results  Component Value Date   LDLCALC 91 04/13/2024   Stable, pt to continue current statin - diet low cholesterol   Renal insufficiency With mild worsening, Pt suspected with mild dehydration, for f/u lab today  B12 deficiency Lab Results  Component Value Date   VITAMINB12 361 04/13/2024   Low normal, for oral replacement with MVI with b12  Followup: Return in about 6 months (around 12/04/2024).  Lynwood Rush, MD 06/09/2024 8:08 PM [facility] [place]  [facility]     [1]  Allergies Allergen Reactions   Shellfish Allergy  Hives  [2]  Current Outpatient Medications on File Prior to Visit  Medication Sig Dispense Refill   cetirizine  (ZYRTEC ) 10 MG tablet Take 1 tablet (10 mg total) by mouth daily. 30 tablet 11   cyclobenzaprine  (FLEXERIL ) 5 MG tablet Take 1 tablet (5 mg total) by mouth 3 (three) times daily as needed. 40 tablet 1   ibuprofen  (ADVIL ) 800 MG tablet Take 1 tablet (800 mg total) by mouth every 8 (eight) hours as needed. 30 tablet 3   scopolamine  (TRANSDERM SCOP , 1.5 MG,) 1 MG/3DAYS Place 1 patch (1.5 mg total) onto the skin every 3 (three) days. 10 patch 2   triamcinolone  ointment (KENALOG ) 0.1 % Apply 1 application topically 2 (two) times daily. 453.6 g 0   No current facility-administered medications on file prior to visit.

## 2024-06-06 NOTE — Patient Instructions (Signed)
 You had the Prevnar 20 pneumonia shot today  You are given the Accomodation letter  Ok to start OTC Multivitamin which contains B12  Please continue all other medications as before, and refills have been done if requested.  Please have the pharmacy call with any other refills you may need.  Please continue your efforts at being more active, low cholesterol diet, and weight control.  You are otherwise up to date with prevention measures today.  Please keep your appointments with your specialists as you may have planned  Please go to the LAB at the blood drawing area for the tests to be done - just the kidney panel today  You will be contacted by phone if any changes need to be made immediately.  Otherwise, you will receive a letter about your results with an explanation, but please check with MyChart first.  Please make an Appointment to return in 6 months, or sooner if needed

## 2024-06-09 ENCOUNTER — Encounter: Payer: Self-pay | Admitting: Internal Medicine

## 2024-06-09 DIAGNOSIS — E538 Deficiency of other specified B group vitamins: Secondary | ICD-10-CM | POA: Insufficient documentation

## 2024-06-09 DIAGNOSIS — N289 Disorder of kidney and ureter, unspecified: Secondary | ICD-10-CM | POA: Insufficient documentation

## 2024-06-09 NOTE — Assessment & Plan Note (Signed)
 Lab Results  Component Value Date   HGBA1C 5.8 04/13/2024   Stable, pt to continue current medical treatment  - diet, wt control

## 2024-06-09 NOTE — Assessment & Plan Note (Signed)
 BP Readings from Last 3 Encounters:  06/06/24 124/80  02/16/24 (!) 130/90  08/29/23 136/82   Stable, pt to continue medical treatment  - diet, wt control

## 2024-06-09 NOTE — Assessment & Plan Note (Signed)
 With mild worsening, Pt suspected with mild dehydration, for f/u lab today

## 2024-06-09 NOTE — Assessment & Plan Note (Signed)
 Lab Results  Component Value Date   VITAMINB12 361 04/13/2024   Low normal, for oral replacement with MVI with b12

## 2024-06-09 NOTE — Assessment & Plan Note (Signed)
 Age and sex appropriate education and counseling updated with regular exercise and diet Referrals for preventative services - none needed Immunizations addressed - for prevnar 20, declines hep b vax for now Smoking counseling  - none needed Evidence for depression or other mood disorder - none significant Most recent labs reviewed. I have personally reviewed and have noted: 1) the patient's medical and social history 2) The patient's current medications and supplements 3) The patient's height, weight, and BMI have been recorded in the chart

## 2024-06-09 NOTE — Assessment & Plan Note (Signed)
 Last vitamin D  Lab Results  Component Value Date   VD25OH 42.98 04/13/2024   Stable, cont oral replacement

## 2024-06-09 NOTE — Assessment & Plan Note (Signed)
 Lab Results  Component Value Date   LDLCALC 91 04/13/2024   Stable, pt to continue current statin - diet low cholesterol
